# Patient Record
Sex: Male | Born: 1955 | Race: White | Hispanic: No | Marital: Single | Smoking: Current every day smoker
Health system: Southern US, Community
[De-identification: ages and names within clinical notes are randomized; demographics above are authoritative.]

## PROBLEM LIST (undated history)

## (undated) DIAGNOSIS — J449 Chronic obstructive pulmonary disease, unspecified: Secondary | ICD-10-CM

## (undated) DIAGNOSIS — I10 Essential (primary) hypertension: Secondary | ICD-10-CM

## (undated) DIAGNOSIS — I739 Peripheral vascular disease, unspecified: Secondary | ICD-10-CM

## (undated) DIAGNOSIS — E7800 Pure hypercholesterolemia, unspecified: Secondary | ICD-10-CM

## (undated) HISTORY — DX: Chronic obstructive pulmonary disease, unspecified: J44.9

## (undated) HISTORY — PX: ELBOW SURGERY: SHX618

## (undated) HISTORY — PX: APPENDECTOMY: SHX54

---

## 2013-03-30 ENCOUNTER — Inpatient Hospital Stay: Payer: Self-pay | Admitting: Internal Medicine

## 2013-03-30 LAB — CBC WITH DIFFERENTIAL/PLATELET
Basophil #: 0.1 10*3/uL (ref 0.0–0.1)
Basophil %: 0.8 %
Eosinophil #: 0.1 10*3/uL (ref 0.0–0.7)
Eosinophil %: 0.4 %
HGB: 16.7 g/dL (ref 13.0–18.0)
Lymphocyte %: 10.4 %
MCHC: 35 g/dL (ref 32.0–36.0)
Monocyte %: 9.6 %
Neutrophil #: 11.3 10*3/uL — ABNORMAL HIGH (ref 1.4–6.5)
Neutrophil %: 78.8 %
RBC: 4.81 10*6/uL (ref 4.40–5.90)
RDW: 13.1 % (ref 11.5–14.5)

## 2013-03-30 LAB — BASIC METABOLIC PANEL
Anion Gap: 7 (ref 7–16)
BUN: 12 mg/dL (ref 7–18)
Chloride: 100 mmol/L (ref 98–107)
Co2: 27 mmol/L (ref 21–32)
EGFR (African American): >60
Osmolality: 268 (ref 275–301)
Sodium: 134 mmol/L — ABNORMAL LOW (ref 136–145)

## 2013-03-31 LAB — BASIC METABOLIC PANEL
Anion Gap: 9 (ref 7–16)
Calcium, Total: 8.7 mg/dL (ref 8.5–10.1)
Co2: 25 mmol/L (ref 21–32)
EGFR (Non-African Amer.): >60
Glucose: 159 mg/dL — ABNORMAL HIGH (ref 65–99)
Sodium: 134 mmol/L — ABNORMAL LOW (ref 136–145)

## 2013-03-31 LAB — CBC WITH DIFFERENTIAL/PLATELET
Basophil %: 0.2 %
Eosinophil #: 0 10*3/uL (ref 0.0–0.7)
Eosinophil %: 0 %
HGB: 15 g/dL (ref 13.0–18.0)
Lymphocyte %: 5.3 %
MCHC: 35.2 g/dL (ref 32.0–36.0)
Monocyte #: 0.4 x10 3/mm (ref 0.2–1.0)
Neutrophil #: 10.7 10*3/uL — ABNORMAL HIGH (ref 1.4–6.5)
RDW: 13.4 % (ref 11.5–14.5)

## 2013-03-31 LAB — PRO B NATRIURETIC PEPTIDE: B-Type Natriuretic Peptide: 569 pg/mL — ABNORMAL HIGH (ref 0–125)

## 2013-04-01 LAB — MAGNESIUM: Magnesium: 2.2 mg/dL

## 2013-04-01 LAB — BASIC METABOLIC PANEL
BUN: 17 mg/dL (ref 7–18)
Calcium, Total: 9.1 mg/dL (ref 8.5–10.1)
Chloride: 102 mmol/L (ref 98–107)
Co2: 24 mmol/L (ref 21–32)
EGFR (Non-African Amer.): >60
Osmolality: 268 (ref 275–301)

## 2013-04-01 LAB — CBC WITH DIFFERENTIAL/PLATELET
Basophil #: 0.1 10*3/uL (ref 0.0–0.1)
Eosinophil #: 0 10*3/uL (ref 0.0–0.7)
HCT: 45.8 % (ref 40.0–52.0)
Lymphocyte #: 0.9 10*3/uL — ABNORMAL LOW (ref 1.0–3.6)
Lymphocyte %: 4 %
MCH: 34.6 pg — ABNORMAL HIGH (ref 26.0–34.0)
MCV: 100 fL (ref 80–100)
Monocyte #: 1.4 x10 3/mm — ABNORMAL HIGH (ref 0.2–1.0)
Platelet: 243 10*3/uL (ref 150–440)
RBC: 4.56 10*6/uL (ref 4.40–5.90)
WBC: 21.6 10*3/uL — ABNORMAL HIGH (ref 3.8–10.6)

## 2013-04-02 LAB — BASIC METABOLIC PANEL
Anion Gap: 5 — ABNORMAL LOW (ref 7–16)
BUN: 22 mg/dL — ABNORMAL HIGH (ref 7–18)
Calcium, Total: 8.6 mg/dL (ref 8.5–10.1)
Co2: 27 mmol/L (ref 21–32)
Creatinine: 0.83 mg/dL (ref 0.60–1.30)
EGFR (African American): >60
Glucose: 134 mg/dL — ABNORMAL HIGH (ref 65–99)
Osmolality: 277 (ref 275–301)
Sodium: 136 mmol/L (ref 136–145)

## 2013-04-04 LAB — CBC WITH DIFFERENTIAL/PLATELET
Basophil #: 0.1 10*3/uL (ref 0.0–0.1)
Basophil %: 0.4 %
HGB: 15 g/dL (ref 13.0–18.0)
Lymphocyte #: 1.6 10*3/uL (ref 1.0–3.6)
Lymphocyte %: 10.5 %
MCH: 34.3 pg — ABNORMAL HIGH (ref 26.0–34.0)
MCHC: 34.4 g/dL (ref 32.0–36.0)
MCV: 100 fL (ref 80–100)
Monocyte #: 1.4 x10 3/mm — ABNORMAL HIGH (ref 0.2–1.0)
Neutrophil #: 12.1 10*3/uL — ABNORMAL HIGH (ref 1.4–6.5)
Neutrophil %: 79.7 %
WBC: 15.1 10*3/uL — ABNORMAL HIGH (ref 3.8–10.6)

## 2014-09-24 NOTE — H&P (Signed)
PATIENT NAME:  Darryl Gonzales, Darryl Gonzales MR#:  [id number] DATE OF BIRTH:  1955/11/20  DATE OF ADMISSION:  03/30/2013  PRIMARY CARE PROVIDER: None.   EMERGENCY DEPARTMENT REFERRING PHYSICIAN: Dr. Shaune PollackLord.   CHIEF COMPLAINT: Shortness of breath, cough.   HISTORY OF PRESENT ILLNESS: The patient is a 59 year old white male with significant history of smoking, greater than 2 to 3 packs per day for 40 years, who has some mild chronic shortness of breath according to him. He reports that since last Saturday he started having worsening shortness of breath. He was tachypneic when he came in. He also reports that he has been having a productive cough. He states that he has not looked at the sputum to see what color it is. He has had some low-grade fevers and has had some body aches and some chills. He also has had wheezing. He denies any chest pains, palpitations. Denies any nausea or vomiting. Reports that he has a little bit of diarrhea as well. He otherwise denies any sick contacts.   PAST MEDICAL HISTORY: History of pneumonia in the past, history of hypertension which is untreated.   PAST SURGICAL HISTORY: Status post tonsillectomy, appendectomy, left leg fracture which was not repaired.   ALLERGIES: SULFA.   CURRENT MEDICATIONS: He uses Advil p.r.n.   SOCIAL HISTORY: Smokes 2 to 3 packs per day for 40 years. Drinks 3 to 4 beers daily. Denies any drug use.   FAMILY HISTORY: No history of lung disease or hypertension.   REVIEW OF SYSTEMS:    CONSTITUTIONAL: Complains of low-grade fevers, fatigue, weakness. Complains of pain all over due to cough. No weight loss. No weight gains.  EYES: No blurred or double vision. No pain. No redness. No inflammation. No glaucoma. No cataracts.  EARS, NOSE, THROAT: No tinnitus. No ear pain. He has chronic hearing loss. No epistaxis. No difficulty swallowing.  RESPIRATORY: Complains of cough, wheezing. No hemoptysis. No diagnosis of COPD before.  CARDIOVASCULAR: Complains of  chest pain with coughing. No orthopnea. No edema. Complains of dyspnea on exertion. No syncope.  GASTROINTESTINAL: No nausea, vomiting. No abdominal pain. No hematemesis. No melena. No ulcer. No GERD. No IBS.  GENITOURINARY: Denies any dysuria, hematuria, renal calculus or frequency.  ENDOCRINE: Denies any polyuria, nocturia or thyroid problems.  HEMATOLOGIC AND LYMPHATIC: Denies any major bruisability or bleeding.  SKIN: No acne. No rash. No changes in mole, hair or skin.  MUSCULOSKELETAL: Denies any pain in neck, back or shoulder.  NEUROLOGIC: No numbness. No CVA. No TIA.  PSYCHIATRIC: No anxiety. No insomnia. No ADD.   PHYSICAL EXAMINATION: VITAL SIGNS: Temperature 98.1, pulse 97, respirations 28, blood pressure 176/87, O2 sat 89% on room air.  GENERAL: The patient is a thin male in mild respiratory distress.  HEENT: Head atraumatic, normocephalic. Pupils equally round, reactive to light and accommodation. There is no conjunctival pallor. No scleral icterus. Nasal exam shows no drainage or ulcerations. Ear: There is no drainage or external lesions. Mouth is moist without any exudate.  NECK: Supple and symmetric. No masses. Thyroid midline, not enlarged. No JVD.  RESPIRATORY: There is some accessory muscle usage. Decrease in breath sounds throughout both lungs. There is no wheezing or rhonchi currently.  CARDIOVASCULAR: Regular rate and rhythm. No murmurs, gallops, clicks or heaves.  ABDOMEN: Soft, nontender, nondistended. Positive bowel sounds x 4.  GENITOURINARY: Deferred.  MUSCULOSKELETAL: There is no erythema or swelling.  SKIN: No rash.  LYMPHATICS: No lymph nodes palpable.  VASCULAR: Good DP, PT pulses.  PSYCHIATRIC: Not anxious.  NEUROLOGIC: Cranial nerves II through XII grossly intact. No focal deficits.   LABORATORY AND RADIOLOGICAL DATA: Glucose 99, BUN 12, creatinine 0.65, sodium 134, potassium 4.1, chloride 100, CO2 of 27, calcium 8.9. WBC 14.3, hemoglobin 16.7; platelet count  is 239. Chest x-ray per interpretation of the ED physician showed no acute abnormality.   ASSESSMENT AND PLAN: The patient is a 59 year old year-old white male with greater than 100 pack-years of smoking, presents with shortness of breath, cough for the past few days, has some chronic shortness of breath.  1.  Acute respiratory failure: Likely due to acute bronchitis and a chronic obstructive pulmonary disease exacerbation, which would be a new diagnosis for this patient. We will place him on nebulizers, IV Solu-Medrol, IV antibiotics with Levaquin. We will get pulmonary evaluation and he will need eventual PFTs.  2.  Acute bronchitis: We will treat him with IV Levaquin.  3.  Accelerated hypertension: I am going to start him on hydrochlorothiazide, p.r.n. hydralazine. 4.  Nicotine addiction: The patient was counseled regarding smoking cessation, 4 minutes spent. Nicotine patch is offered, which the patient states that he will take.  5.  Miscellaneous: Will place him on Lovenox for deep vein thrombosis prophylaxis.   NOTE: 45 minutes spent on this H and P.     ____________________________ Lacie Scotts. Allena Katz, MD shp:jm D: 03/30/2013 17:20:08 ET T: 03/30/2013 19:00:34 ET JOB#: [id number]  cc: Myan Locatelli H. Allena Katz, MD, <Dictator>  Charise Carwin MD ELECTRONICALLY SIGNED 03/31/2013 12:18

## 2014-09-24 NOTE — Discharge Summary (Signed)
PATIENT NAME:  Darryl BondBARE, Darryl Gonzales MR#:  [id number] DATE OF BIRTH:  [birth year]  DATE OF ADMISSION:  03/30/2013 DATE OF DISCHARGE:  04/05/2013  DISCHARGE DIAGNOSES: 1. Chronic obstructive pulmonary disease exacerbation.  2. Hypertension.  3. Hyponatremia.  4. Generalized anxiety.  5. Alcoholism.   CONDITION ON DISCHARGE: Stable.   CODE STATUS: FULL CODE.   MEDICATIONS ON DISCHARGE: 1. Prednisone 10 mg oral tablet start 60 mg and taper x 5 mg daily until complete.  2. Acetaminophen and oxycodone every six hours as needed for pain.  3. Alprazolam 1 mg oral tablet every eight hours for seven days.  4. Fluticasone and salmeterol inhalation 2 times a day.  5. Spiriva 18 mcg 1 capsule once a day.  6. Amlodipine 5 mg oral tablet once a day.  7. Albuterol inhalation 2 puffs 4 times a day.  8. Pulmicort Flexhaler 1 puff 2 times a day.  9. Nicotine patch transdermal once a day.  10. Lorazepam 0.5 mg oral tablet every six hours as needed for anxiety.  Advised to have low sodium diet. Regular consistency.   ACTIVITY: As tolerated.   TIMEFRAME TO FOLLOW-UP: Within one to two weeks with primary care physician, and  Dr. Ned ClinesHerbon Fleming, pulmonologist.   HISTORY OF PRESENT ILLNESS: A 59 year old male with significant history of smoking for 2 to 3 packs per day for 40 years started having worsening of shortness of breath, was tachypneic when he came, had productive cough and he has some low-grade fever, body aches and chills. Denied any chest pain, palpitation. He also had  with diarrhea, otherwise he denied any sick contacts.   HOSPITAL COURSE AND STAY:  1. For acute respiratory failure due to chronic obstructive pulmonary disease exacerbation, he was started on IV antibiotics, IV steroids. Spiriva and nebulizer treatment and gradual slow improvement in his overall condition over next 4 to 5 days and so we advised him to continue following with pulmonologist as he has never seen one.  2. Acute  bronchitis we gave IV Levaquin and had significant improvement in his symptoms.  3. Accelerated  hypertension. We started on hydrochlorothiazide, hydralazine, Norvasc.  4. Generalized anxiety.  5. Nicotine addiction. The patient was counseled regarding smoking cessation.  6. Hyponatremia. It was minimal and stable.  IMPORTANT LABORATORY RESULTS IN THE HOSPITAL: Creatinine was 0.65. Sodium was 134 on admission. Blood culture was no growth. White cell count was 14.3, and hemoglobin was 16.7. Chest x-ray portable on admission showed no evidence of acute cardiopulmonary disease. Influenza A and B was negative. Creatinine was 0.93, BNP was 569. WBC count went up to 21,000, possibly due to steroids.   TOTAL TIME SPENT ON THIS DISCHARGE: 40 minutes.   ____________________________ Hope PigeonVaibhavkumar G. Elisabeth PigeonVachhani, MD vgv:sg D: 04/09/2013 08:20:09 ET T: 04/09/2013 08:57:37 ET JOB#: [id number]  cc: Hope PigeonVaibhavkumar G. Elisabeth PigeonVachhani, MD, <Dictator> Myrla HalstedMaurice Almondre Herbon E. Meredeth IdeFleming, MD  Heath GoldVAIBHAVKUMAR [facility] Hospital[facility] MD ELECTRONICALLY SIGNED 04/20/2013 8:09

## 2014-11-09 ENCOUNTER — Other Ambulatory Visit: Payer: Self-pay | Admitting: Specialist

## 2014-11-09 DIAGNOSIS — I739 Peripheral vascular disease, unspecified: Secondary | ICD-10-CM

## 2014-11-09 DIAGNOSIS — M79605 Pain in left leg: Secondary | ICD-10-CM

## 2014-11-09 DIAGNOSIS — M79604 Pain in right leg: Secondary | ICD-10-CM

## 2015-01-18 ENCOUNTER — Encounter: Payer: Self-pay | Admitting: *Deleted

## 2015-01-18 ENCOUNTER — Encounter
Admission: RE | Disposition: A | Discharge status: Home / Self Care | Payer: Self-pay | Source: Ambulatory Visit | Attending: Vascular Surgery

## 2015-01-18 ENCOUNTER — Ambulatory Visit
Admission: RE | Admit: 2015-01-18 | Discharge: 2015-01-18 | Disposition: A | Discharge status: Home / Self Care | Payer: BLUE CROSS/BLUE SHIELD | Source: Ambulatory Visit | Attending: Vascular Surgery | Admitting: Vascular Surgery

## 2015-01-18 DIAGNOSIS — F17210 Nicotine dependence, cigarettes, uncomplicated: Secondary | ICD-10-CM | POA: Insufficient documentation

## 2015-01-18 DIAGNOSIS — I70213 Atherosclerosis of native arteries of extremities with intermittent claudication, bilateral legs: Secondary | ICD-10-CM | POA: Insufficient documentation

## 2015-01-18 DIAGNOSIS — I10 Essential (primary) hypertension: Secondary | ICD-10-CM | POA: Insufficient documentation

## 2015-01-18 DIAGNOSIS — Z79899 Other long term (current) drug therapy: Secondary | ICD-10-CM | POA: Diagnosis not present

## 2015-01-18 DIAGNOSIS — E780 Pure hypercholesterolemia: Secondary | ICD-10-CM | POA: Diagnosis not present

## 2015-01-18 HISTORY — DX: Essential (primary) hypertension: I10

## 2015-01-18 HISTORY — PX: PERIPHERAL VASCULAR CATHETERIZATION: SHX172C

## 2015-01-18 HISTORY — DX: Peripheral vascular disease, unspecified: I73.9

## 2015-01-18 HISTORY — DX: Pure hypercholesterolemia, unspecified: E78.00

## 2015-01-18 LAB — CREATININE, SERUM: Creatinine, Ser: 0.95 mg/dL (ref 0.61–1.24)

## 2015-01-18 LAB — BUN: BUN: 21 mg/dL — ABNORMAL HIGH (ref 6–20)

## 2015-01-18 SURGERY — LOWER EXTREMITY ANGIOGRAPHY
Anesthesia: Moderate Sedation | Laterality: Left

## 2015-01-18 MED ORDER — HEPARIN (PORCINE) IN NACL 2-0.9 UNIT/ML-% IJ SOLN
INTRAMUSCULAR | Status: AC
  Filled 2015-01-18: qty 1000

## 2015-01-18 MED ORDER — IOHEXOL 300 MG/ML  SOLN
INTRAMUSCULAR | Status: DC | PRN
  Administered 2015-01-18: 80 mL via INTRA_ARTERIAL

## 2015-01-18 MED ORDER — HYDROMORPHONE HCL 1 MG/ML IJ SOLN: 0.5000 mg | INTRAMUSCULAR | Status: DC | PRN

## 2015-01-18 MED ORDER — HEPARIN SODIUM (PORCINE) 1000 UNIT/ML IJ SOLN
INTRAMUSCULAR | Status: AC
  Filled 2015-01-18: qty 1

## 2015-01-18 MED ORDER — ONDANSETRON HCL 4 MG/2ML IJ SOLN: 4.0000 mg | Freq: Four times a day (QID) | INTRAMUSCULAR | Status: DC | PRN

## 2015-01-18 MED ORDER — CLOPIDOGREL BISULFATE 75 MG PO TABS
300.0000 mg | ORAL_TABLET | ORAL | Status: AC
  Administered 2015-01-18: 300 mg via ORAL

## 2015-01-18 MED ORDER — FENTANYL CITRATE (PF) 100 MCG/2ML IJ SOLN
INTRAMUSCULAR | Status: AC
  Filled 2015-01-18: qty 2

## 2015-01-18 MED ORDER — MIDAZOLAM HCL 2 MG/2ML IJ SOLN
INTRAMUSCULAR | Status: DC | PRN
  Administered 2015-01-18: 2 mg via INTRAVENOUS
  Administered 2015-01-18 (×2): 1 mg via INTRAVENOUS
  Administered 2015-01-18: 2 mg via INTRAVENOUS

## 2015-01-18 MED ORDER — OXYCODONE HCL 5 MG PO TABS: 5.0000 mg | ORAL_TABLET | ORAL | Status: DC | PRN

## 2015-01-18 MED ORDER — MIDAZOLAM HCL 2 MG/2ML IJ SOLN
INTRAMUSCULAR | Status: AC
  Filled 2015-01-18: qty 2

## 2015-01-18 MED ORDER — CEFAZOLIN SODIUM 1-5 GM-% IV SOLN
INTRAVENOUS | Status: AC
  Filled 2015-01-18: qty 50

## 2015-01-18 MED ORDER — LIDOCAINE HCL (PF) 1 % IJ SOLN
INTRAMUSCULAR | Status: DC | PRN
  Administered 2015-01-18: 5 mL via INTRADERMAL

## 2015-01-18 MED ORDER — CLOPIDOGREL BISULFATE 75 MG PO TABS
ORAL_TABLET | ORAL | Status: AC
  Filled 2015-01-18: qty 4

## 2015-01-18 MED ORDER — CEFAZOLIN SODIUM 1-5 GM-% IV SOLN
1.0000 g | Freq: Once | INTRAVENOUS | Status: AC
  Administered 2015-01-18: 1 g via INTRAVENOUS

## 2015-01-18 MED ORDER — CLOPIDOGREL BISULFATE 75 MG PO TABS: 75.0000 mg | ORAL_TABLET | Freq: Every day | ORAL | Status: DC

## 2015-01-18 MED ORDER — ACETAMINOPHEN 325 MG PO TABS: 325.0000 mg | ORAL_TABLET | ORAL | Status: DC | PRN

## 2015-01-18 MED ORDER — SODIUM CHLORIDE 0.9 % IJ SOLN
INTRAMUSCULAR | Status: AC
  Filled 2015-01-18: qty 15

## 2015-01-18 MED ORDER — MIDAZOLAM HCL 5 MG/5ML IJ SOLN
INTRAMUSCULAR | Status: AC
  Filled 2015-01-18: qty 5

## 2015-01-18 MED ORDER — LIDOCAINE HCL (PF) 1 % IJ SOLN
INTRAMUSCULAR | Status: AC
  Filled 2015-01-18: qty 10

## 2015-01-18 MED ORDER — FENTANYL CITRATE (PF) 100 MCG/2ML IJ SOLN
INTRAMUSCULAR | Status: DC | PRN
  Administered 2015-01-18 (×2): 50 ug via INTRAVENOUS
  Administered 2015-01-18: 100 ug via INTRAVENOUS
  Administered 2015-01-18: 50 ug via INTRAVENOUS

## 2015-01-18 MED ORDER — ACETAMINOPHEN 325 MG RE SUPP
325.0000 mg | RECTAL | Status: DC | PRN
Start: 2015-01-18 — End: 2015-01-18

## 2015-01-18 MED ORDER — SODIUM CHLORIDE 0.9 % IV SOLN
INTRAVENOUS | Status: DC
  Administered 2015-01-18 (×2): via INTRAVENOUS

## 2015-01-18 MED ORDER — HEPARIN SODIUM (PORCINE) 1000 UNIT/ML IJ SOLN
INTRAMUSCULAR | Status: DC | PRN
  Administered 2015-01-18: 4000 [IU] via INTRAVENOUS

## 2015-01-18 SURGICAL SUPPLY — 20 items
BALLN LUTONIX 5X150X130 (BALLOONS) ×4
BALLN LUTONIX 6X150X130 (BALLOONS) ×4
BALLN LUTONIX DCB 5X100X130 (BALLOONS) ×4
BALLOON LUTONIX 5X150X130 (BALLOONS) ×2 IMPLANT
BALLOON LUTONIX 6X150X130 (BALLOONS) ×2 IMPLANT
BALLOON LUTONIX DCB 5X100X130 (BALLOONS) ×2 IMPLANT
CATH 5FX125 BOWTIP (CATHETERS) ×4 IMPLANT
CATH PIG 70CM (CATHETERS) ×4 IMPLANT
DEVICE PRESTO INFLATION (MISCELLANEOUS) ×4 IMPLANT
DEVICE SAFEGUARD 24CM (GAUZE/BANDAGES/DRESSINGS) ×4 IMPLANT
DEVICE STARCLOSE SE CLOSURE (Vascular Products) ×4 IMPLANT
GLIDEWIRE ANGLED SS 035X260CM (WIRE) ×4 IMPLANT
PACK ANGIOGRAPHY (CUSTOM PROCEDURE TRAY) ×4 IMPLANT
SET INTRO CAPELLA COAXIAL (SET/KITS/TRAYS/PACK) ×4 IMPLANT
SHEATH ANL2 6FRX45 HC (SHEATH) ×4 IMPLANT
SHEATH BRITE TIP 5FRX11 (SHEATH) ×4 IMPLANT
SYR MEDRAD MARK V 150ML (SYRINGE) ×4 IMPLANT
TUBING CONTRAST HIGH PRESS 72 (TUBING) ×4 IMPLANT
WIRE HI TORQ VERSACORE 300 (WIRE) ×4 IMPLANT
WIRE J 3MM .035X145CM (WIRE) ×4 IMPLANT

## 2015-01-18 NOTE — Progress Notes (Signed)
Pt doing well post procedure, no bleeding nor hematoma visible at right groin site, pt has nagging nonproductive cough intermittently.(history of long term tobacco use), encouraged pt to hold right groin when coughing to discourage bleeding  At procedure site, tends not to do so when encouraged . Taking po;'s without difficulty, script given for plavix with loading dose given earlier per orders.

## 2015-01-18 NOTE — H&P (Signed)
[facility] History & Physical Update  The patient was interviewed and re-examined.  The patient's previous History and Physical has been reviewed and is unchanged.  There is no change in the plan of care. We plan to proceed with the scheduled procedure.  Randol Zumstein G, MD  01/18/2015, 11:43 AM   

## 2015-01-18 NOTE — Discharge Instructions (Signed)

## 2015-01-18 NOTE — Op Note (Signed)
[facility]  Percutaneous Study/Intervention Procedural Note   Date of Surgery: 01/18/2015  Surgeon:Schnier, Dolores Lory   Pre-operative Diagnosis: Atherosclerotic occlusive disease bilateral lower extremities with lifestyle limiting claudication; painful lower extremity; hypertension  Post-operative diagnosis:  Same  Procedure(s) Performed:  1.  Abdominal aortogram  2.  Left lower extremity distal runoff third order catheter placement  3.  Percutaneous transluminal and plasty of the left SFA to 6 mm using the Lutonix balloon  4.  Closure right femoral puncture site with a Star close   Anesthesia: Conscious sedation  Sheath: 6 French right common femoral artery retrograde  Contrast: 80 mls  Fluoroscopy Time: 7.7 minutes  Indications:  Patient presented to the office with the complaint of lower sternum any pain he is still working and is finding it increasingly hard to do his job and describes lifestyle limitations. Noninvasive studies as well as physical examination suggested significant atherosclerotic occlusive disease. He is therefore undergoing angiography with the hope for intervention. Risks and benefits of been reviewed all questions of been answered patient agrees to proceed  Procedure:  HARDIE VELTRE a 59 y.o. male who was identified and appropriate procedural time out was performed.  The patient was then placed supine on the table and prepped and draped in the usual sterile fashion.  Ultrasound was used to evaluate the right common femoral artery.  It was patent .  A digital ultrasound image was acquired.  A micropuncture needle was used to access the right common femoral artery under direct ultrasound guidance and a permanent image was performed.  Microwire followed by a micro-sheath was then inserted without difficulty under fluoroscopic guidance. A 0.035 J wire was advanced without resistance and a 5Fr sheath was placed.    Pigtail catheter is  positioned to the level of T12 and AP projection of the aorta was obtained. Pigtail catheter was repositioned to above the bifurcation and an RAO view of the pelvis was obtained. Stiff angled Glidewire pigtail catheter were then used to cross the aortic bifurcation and LAO projection of the left femoral bifurcation was obtained. Wire was then reintroduced and negotiated into the SFA. Distal runoff was obtained via the pigtail catheter located in the SFA. Distal imaging were still inadequate and a later point in the case straight catheter positioned in the distal popliteal was used to complete runoff. After reviewing these images 4000 units of heparin was given and allowed circulate for several minutes.  Stiff angle glide wires reintroduced pigtail catheter was removed and a 6 [place] Ansell sheath is advanced up and over the bifurcation and positioned with its tip in the common femoral artery on the left. Using a straight catheter and an angled Glidewire the lesions within the SFA were negotiated intraluminal positioning was verified with hand injection contrast in the mid popliteal via straight catheter.  Versa core Wire was then reintroduced.    Initially a 5 x 15 and then a 5 x 10 balloon was used and plasty the SFA inflations were to 10 atm for 3 full minutes. Subsequent angiography demonstrated the midportion was still undersized and therefore a 6 x 15 balloon was advanced across this area and inflation to 810 atm for 3 minutes was performed. Follow-up imaging demonstrates an excellent result with less than 10% residual stenosis and no evidence of flow limitation or dissection. Hand injection contrast was then used to perform distal runoff which demonstrated preservation of the outflow.  Sheath was pulled into the right external iliac  oblique views obtained and a Star close device deployed without difficulty or no immediate complications    Findings:   Aortogram:  The abdominal aorta is opacified the  bolus injection contrast. There are mild atherosclerotic changes but no evidence of hemodynamically significant lesions.  Right Lower Extremity:  The right common femoral origin of the profunda femoris and sent superficial femoral arteries are patent. Common and external iliac arteries on the right are patent.  Left Lower Extremity:  The left common and external iliac arteries are widely patent no evidence of hematoma and anatomically significant lesions. 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 The origin of the SFA and the profunda are both patent. Within the SFA in its middle two thirds there is diffuse disease with several areas of string sign in tandem. At [facility] canal the SFA is more normalized in caliber and the above-the-knee popliteal is patent. Trifurcation is patent and there is two-vessel runoff to the foot via the anterior tibial and the posterior tibial area posterior tibial and and the peroneal are rather small and demonstrate diffuse disease.  Following angioplasty first to 5 and the 6 mm there is excellent result across the SFA no stent is indicated at this time  Summary successful recanalization of the left SFA using Lutonix balloon antroplasty to a maximal dimension of 6 mm  Disposition: Patient was taken to the recovery room in stable condition having tolerated the procedure well.  Schnier, Dolores Lory 01/18/2015,11:44 AM

## 2015-07-16 IMAGING — CR DG CHEST 1V PORT
1 series · 1 of 1 positions shown · non-contrast
Comparison: none

REASON FOR EXAM: sob
COMMENTS:

PROCEDURE:     DXR - DXR PORTABLE CHEST SINGLE VIEW  - March 30, 2013  [DATE]
RESULT:     The lungs are clear. The cardiac silhouette and visualized bony
skeleton are unremarkable.

[ap]
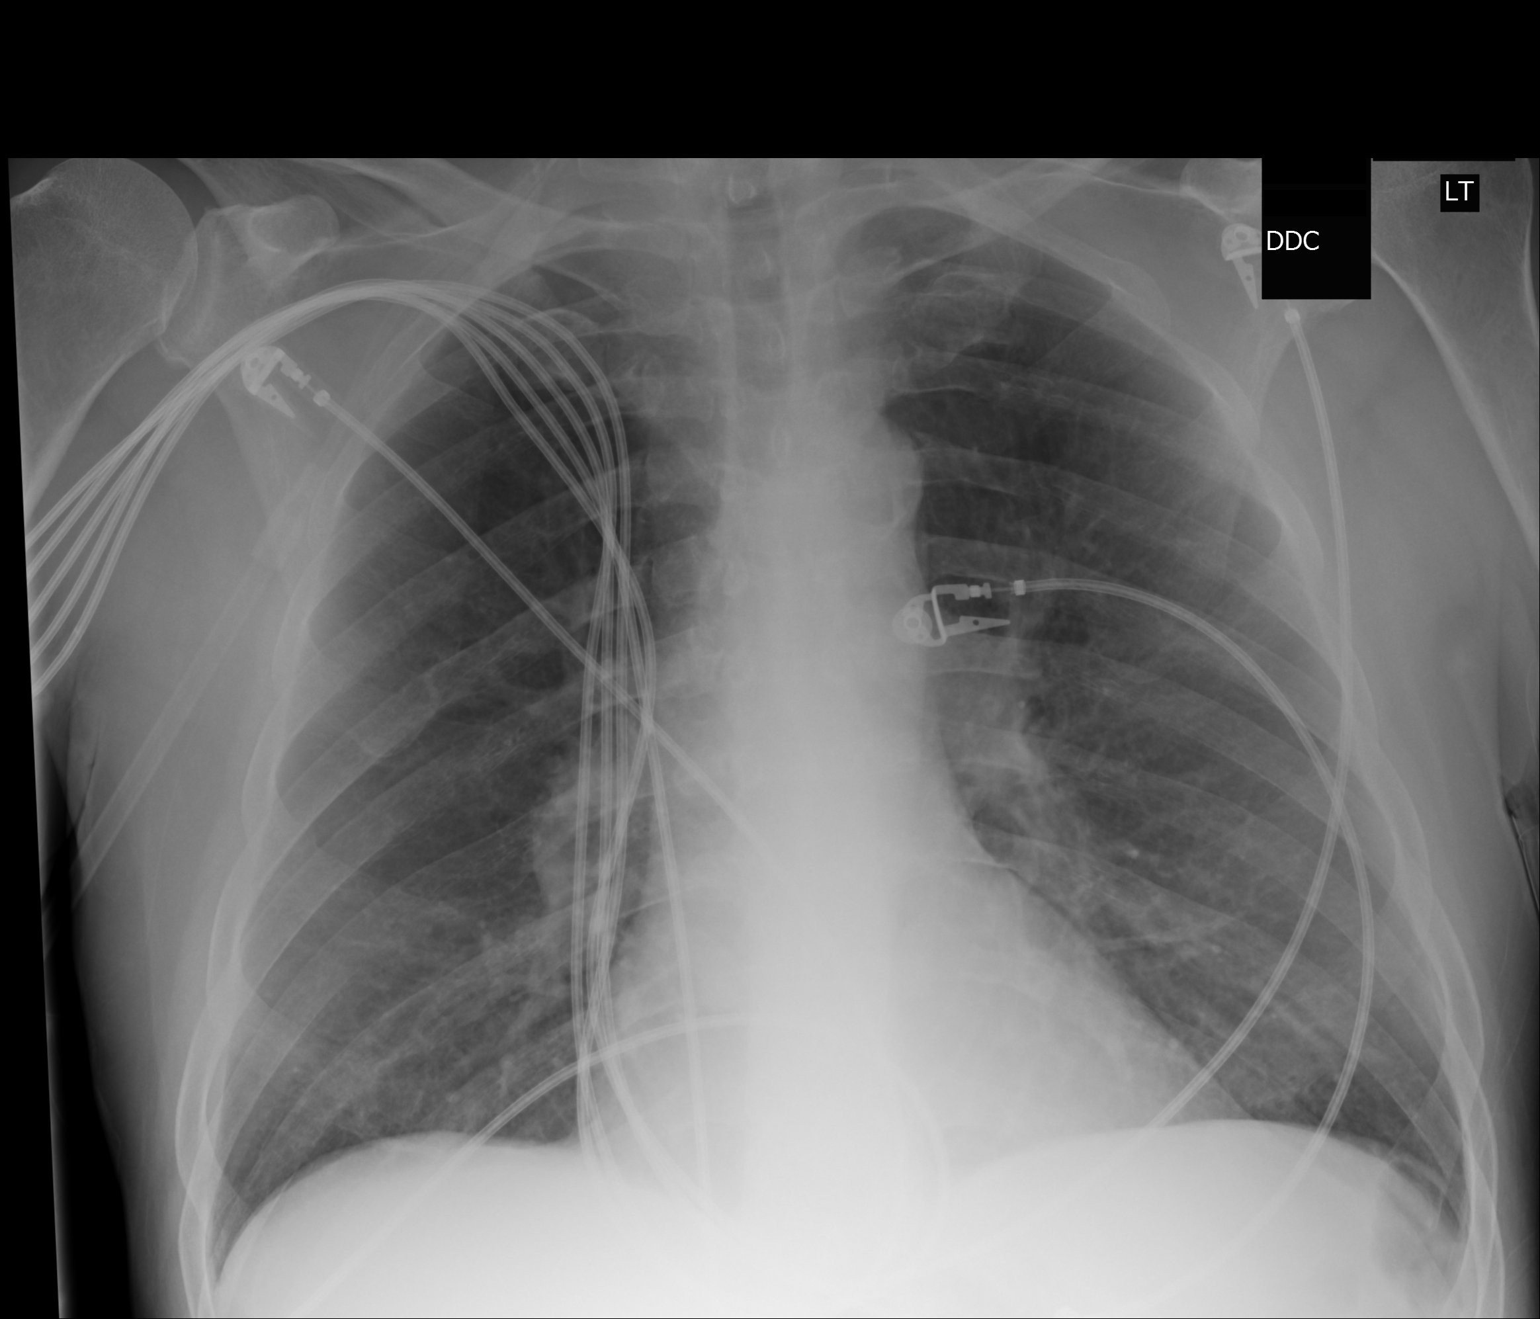

[1 of 1 positions shown; findings below may reference images not displayed]

IMPRESSION: 1. Chest radiograph without evidence of acute cardiopulmonary disease.

## 2016-11-13 ENCOUNTER — Inpatient Hospital Stay
Admission: EM | Admit: 2016-11-13 | Discharge: 2016-11-18 | DRG: 190 | Disposition: A | Discharge status: Home / Self Care | Payer: BLUE CROSS/BLUE SHIELD | Attending: Internal Medicine | Admitting: Internal Medicine

## 2016-11-13 ENCOUNTER — Emergency Department: Payer: BLUE CROSS/BLUE SHIELD

## 2016-11-13 ENCOUNTER — Inpatient Hospital Stay
Admit: 2016-11-13 | Discharge: 2016-11-13 | Disposition: A | Discharge status: Home / Self Care | Payer: BLUE CROSS/BLUE SHIELD | Attending: Internal Medicine | Admitting: Internal Medicine

## 2016-11-13 DIAGNOSIS — F17210 Nicotine dependence, cigarettes, uncomplicated: Secondary | ICD-10-CM | POA: Diagnosis present

## 2016-11-13 DIAGNOSIS — R739 Hyperglycemia, unspecified: Secondary | ICD-10-CM | POA: Diagnosis not present

## 2016-11-13 DIAGNOSIS — J810 Acute pulmonary edema: Secondary | ICD-10-CM | POA: Diagnosis not present

## 2016-11-13 DIAGNOSIS — T380X5A Adverse effect of glucocorticoids and synthetic analogues, initial encounter: Secondary | ICD-10-CM | POA: Diagnosis not present

## 2016-11-13 DIAGNOSIS — Z515 Encounter for palliative care: Secondary | ICD-10-CM

## 2016-11-13 DIAGNOSIS — J441 Chronic obstructive pulmonary disease with (acute) exacerbation: Secondary | ICD-10-CM

## 2016-11-13 DIAGNOSIS — Z7189 Other specified counseling: Secondary | ICD-10-CM

## 2016-11-13 DIAGNOSIS — Z7902 Long term (current) use of antithrombotics/antiplatelets: Secondary | ICD-10-CM | POA: Diagnosis not present

## 2016-11-13 DIAGNOSIS — R609 Edema, unspecified: Secondary | ICD-10-CM

## 2016-11-13 DIAGNOSIS — I739 Peripheral vascular disease, unspecified: Secondary | ICD-10-CM | POA: Diagnosis present

## 2016-11-13 DIAGNOSIS — I5031 Acute diastolic (congestive) heart failure: Secondary | ICD-10-CM | POA: Diagnosis present

## 2016-11-13 DIAGNOSIS — Z79899 Other long term (current) drug therapy: Secondary | ICD-10-CM | POA: Diagnosis not present

## 2016-11-13 DIAGNOSIS — J209 Acute bronchitis, unspecified: Secondary | ICD-10-CM | POA: Diagnosis present

## 2016-11-13 DIAGNOSIS — J9601 Acute respiratory failure with hypoxia: Secondary | ICD-10-CM

## 2016-11-13 DIAGNOSIS — R443 Hallucinations, unspecified: Secondary | ICD-10-CM | POA: Diagnosis not present

## 2016-11-13 DIAGNOSIS — I110 Hypertensive heart disease with heart failure: Secondary | ICD-10-CM | POA: Diagnosis present

## 2016-11-13 DIAGNOSIS — I248 Other forms of acute ischemic heart disease: Secondary | ICD-10-CM | POA: Diagnosis present

## 2016-11-13 DIAGNOSIS — F10239 Alcohol dependence with withdrawal, unspecified: Secondary | ICD-10-CM | POA: Diagnosis not present

## 2016-11-13 DIAGNOSIS — E7800 Pure hypercholesterolemia, unspecified: Secondary | ICD-10-CM | POA: Diagnosis present

## 2016-11-13 DIAGNOSIS — G629 Polyneuropathy, unspecified: Secondary | ICD-10-CM | POA: Diagnosis present

## 2016-11-13 DIAGNOSIS — J449 Chronic obstructive pulmonary disease, unspecified: Secondary | ICD-10-CM

## 2016-11-13 DIAGNOSIS — Z66 Do not resuscitate: Secondary | ICD-10-CM | POA: Diagnosis present

## 2016-11-13 DIAGNOSIS — E871 Hypo-osmolality and hyponatremia: Secondary | ICD-10-CM | POA: Diagnosis present

## 2016-11-13 DIAGNOSIS — J440 Chronic obstructive pulmonary disease with acute lower respiratory infection: Secondary | ICD-10-CM | POA: Diagnosis present

## 2016-11-13 DIAGNOSIS — J9600 Acute respiratory failure, unspecified whether with hypoxia or hypercapnia: Secondary | ICD-10-CM | POA: Diagnosis present

## 2016-11-13 DIAGNOSIS — R0602 Shortness of breath: Secondary | ICD-10-CM | POA: Diagnosis present

## 2016-11-13 LAB — CBC
HEMATOCRIT: 49.5 % (ref 40.0–52.0)
Hemoglobin: 16.2 g/dL (ref 13.0–18.0)
MCH: 33.9 pg (ref 26.0–34.0)
MCHC: 32.8 g/dL (ref 32.0–36.0)
MCV: 103.4 fL — AB (ref 80.0–100.0)
Platelets: 208 10*3/uL (ref 150–440)
RBC: 4.79 MIL/uL (ref 4.40–5.90)
RDW: 15.1 % — AB (ref 11.5–14.5)
WBC: 11.5 10*3/uL — ABNORMAL HIGH (ref 3.8–10.6)

## 2016-11-13 LAB — BASIC METABOLIC PANEL
Anion gap: 11 (ref 5–15)
BUN: 16 mg/dL (ref 6–20)
CHLORIDE: 101 mmol/L (ref 101–111)
CO2: 22 mmol/L (ref 22–32)
Calcium: 8.3 mg/dL — ABNORMAL LOW (ref 8.9–10.3)
Creatinine, Ser: 1.02 mg/dL (ref 0.61–1.24)
GFR calc Af Amer: >60 mL/min (ref >60)
GFR calc non Af Amer: >60 mL/min (ref >60)
GLUCOSE: 93 mg/dL (ref 65–99)
POTASSIUM: 4.2 mmol/L (ref 3.5–5.1)
Sodium: 134 mmol/L — ABNORMAL LOW (ref 135–145)

## 2016-11-13 LAB — BRAIN NATRIURETIC PEPTIDE: B Natriuretic Peptide: 377 pg/mL — ABNORMAL HIGH (ref 0.0–100.0)

## 2016-11-13 LAB — TROPONIN I: Troponin I: 0.04 ng/mL (ref <0.03)

## 2016-11-13 LAB — MAGNESIUM: Magnesium: 1.8 mg/dL (ref 1.7–2.4)

## 2016-11-13 MED ORDER — LORAZEPAM 2 MG/ML IJ SOLN
2.0000 mg | INTRAMUSCULAR | Status: DC | PRN
  Administered 2016-11-15 (×2): 2 mg via INTRAVENOUS
  Filled 2016-11-13 (×2): qty 1

## 2016-11-13 MED ORDER — MOMETASONE FURO-FORMOTEROL FUM 200-5 MCG/ACT IN AERO
2.0000 | INHALATION_SPRAY | Freq: Two times a day (BID) | RESPIRATORY_TRACT | Status: DC
  Administered 2016-11-13 – 2016-11-14 (×2): 2 via RESPIRATORY_TRACT
  Filled 2016-11-13: qty 8.8

## 2016-11-13 MED ORDER — METHYLPREDNISOLONE SODIUM SUCC 125 MG IJ SOLR
60.0000 mg | Freq: Four times a day (QID) | INTRAMUSCULAR | Status: DC
  Administered 2016-11-13 – 2016-11-14 (×3): 60 mg via INTRAVENOUS
  Filled 2016-11-13 (×3): qty 2

## 2016-11-13 MED ORDER — DEXTROSE 5 % IV SOLN
500.0000 mg | Freq: Once | INTRAVENOUS | Status: AC
  Administered 2016-11-13: 500 mg via INTRAVENOUS
  Filled 2016-11-13: qty 500

## 2016-11-13 MED ORDER — AZITHROMYCIN 500 MG IV SOLR
250.0000 mg | INTRAVENOUS | Status: DC
  Administered 2016-11-14 – 2016-11-15 (×2): 250 mg via INTRAVENOUS
  Filled 2016-11-13 (×3): qty 250

## 2016-11-13 MED ORDER — VITAMIN B-1 100 MG PO TABS
100.0000 mg | ORAL_TABLET | Freq: Every day | ORAL | Status: DC
  Administered 2016-11-13 – 2016-11-18 (×6): 100 mg via ORAL
  Filled 2016-11-13 (×6): qty 1

## 2016-11-13 MED ORDER — DEXTROSE 5 % IV SOLN
1.0000 g | INTRAVENOUS | Status: DC
  Filled 2016-11-13: qty 10

## 2016-11-13 MED ORDER — IPRATROPIUM-ALBUTEROL 0.5-2.5 (3) MG/3ML IN SOLN
3.0000 mL | Freq: Once | RESPIRATORY_TRACT | Status: AC
  Administered 2016-11-13: 3 mL via RESPIRATORY_TRACT
  Filled 2016-11-13: qty 3

## 2016-11-13 MED ORDER — FOLIC ACID 1 MG PO TABS
1.0000 mg | ORAL_TABLET | Freq: Every day | ORAL | Status: DC
  Administered 2016-11-13 – 2016-11-18 (×6): 1 mg via ORAL
  Filled 2016-11-13 (×6): qty 1

## 2016-11-13 MED ORDER — LORAZEPAM 2 MG PO TABS
2.0000 mg | ORAL_TABLET | ORAL | Status: DC | PRN
  Administered 2016-11-13 – 2016-11-18 (×25): 2 mg via ORAL
  Filled 2016-11-13 (×25): qty 1

## 2016-11-13 MED ORDER — NICOTINE 21 MG/24HR TD PT24
21.0000 mg | MEDICATED_PATCH | Freq: Every day | TRANSDERMAL | Status: DC
  Administered 2016-11-13 – 2016-11-18 (×6): 21 mg via TRANSDERMAL
  Filled 2016-11-13 (×6): qty 1

## 2016-11-13 MED ORDER — THIAMINE HCL 100 MG/ML IJ SOLN
100.0000 mg | Freq: Every day | INTRAMUSCULAR | Status: DC
  Filled 2016-11-13: qty 2

## 2016-11-13 MED ORDER — PNEUMOCOCCAL VAC POLYVALENT 25 MCG/0.5ML IJ INJ
0.5000 mL | INJECTION | INTRAMUSCULAR | Status: AC
  Administered 2016-11-14: 11:00:00 0.5 mL via INTRAMUSCULAR
  Filled 2016-11-13: qty 0.5

## 2016-11-13 MED ORDER — DOCUSATE SODIUM 100 MG PO CAPS
100.0000 mg | ORAL_CAPSULE | Freq: Two times a day (BID) | ORAL | Status: DC | PRN
  Administered 2016-11-16: 100 mg via ORAL
  Filled 2016-11-13: qty 1

## 2016-11-13 MED ORDER — FUROSEMIDE 10 MG/ML IJ SOLN
20.0000 mg | Freq: Two times a day (BID) | INTRAMUSCULAR | Status: DC
  Administered 2016-11-13 – 2016-11-17 (×8): 20 mg via INTRAVENOUS
  Filled 2016-11-13 (×8): qty 2

## 2016-11-13 MED ORDER — ADULT MULTIVITAMIN W/MINERALS CH
1.0000 | ORAL_TABLET | Freq: Every day | ORAL | Status: DC
  Administered 2016-11-14 – 2016-11-18 (×5): 1 via ORAL
  Filled 2016-11-13 (×5): qty 1

## 2016-11-13 MED ORDER — HEPARIN SODIUM (PORCINE) 5000 UNIT/ML IJ SOLN
5000.0000 [IU] | Freq: Three times a day (TID) | INTRAMUSCULAR | Status: DC
  Administered 2016-11-13 – 2016-11-14 (×2): 5000 [IU] via SUBCUTANEOUS
  Filled 2016-11-13 (×2): qty 1

## 2016-11-13 MED ORDER — METHYLPREDNISOLONE SODIUM SUCC 125 MG IJ SOLR
125.0000 mg | Freq: Once | INTRAMUSCULAR | Status: AC
  Administered 2016-11-13: 125 mg via INTRAVENOUS
  Filled 2016-11-13: qty 2

## 2016-11-13 MED ORDER — MAGNESIUM SULFATE 2 GM/50ML IV SOLN
2.0000 g | Freq: Once | INTRAVENOUS | Status: AC
  Administered 2016-11-13: 2 g via INTRAVENOUS
  Filled 2016-11-13: qty 50

## 2016-11-13 MED ORDER — GABAPENTIN 300 MG PO CAPS
300.0000 mg | ORAL_CAPSULE | Freq: Three times a day (TID) | ORAL | Status: DC
Start: 2016-11-13 — End: 2016-11-18
  Administered 2016-11-13 – 2016-11-18 (×14): 300 mg via ORAL
  Filled 2016-11-13 (×14): qty 1

## 2016-11-13 MED ORDER — DEXTROSE 5 % IV SOLN
1.0000 g | Freq: Once | INTRAVENOUS | Status: AC
  Administered 2016-11-13: 1 g via INTRAVENOUS
  Filled 2016-11-13: qty 10

## 2016-11-13 MED ORDER — TIOTROPIUM BROMIDE MONOHYDRATE 18 MCG IN CAPS
18.0000 ug | ORAL_CAPSULE | Freq: Every day | RESPIRATORY_TRACT | Status: DC
  Administered 2016-11-14 – 2016-11-18 (×5): 18 ug via RESPIRATORY_TRACT
  Filled 2016-11-13 (×2): qty 5

## 2016-11-13 MED ORDER — BUDESONIDE 0.25 MG/2ML IN SUSP
0.2500 mg | Freq: Two times a day (BID) | RESPIRATORY_TRACT | Status: DC
  Administered 2016-11-14 (×3): 0.25 mg via RESPIRATORY_TRACT
  Filled 2016-11-13 (×4): qty 2

## 2016-11-13 MED ORDER — IPRATROPIUM-ALBUTEROL 0.5-2.5 (3) MG/3ML IN SOLN
3.0000 mL | RESPIRATORY_TRACT | Status: DC
  Administered 2016-11-13 – 2016-11-14 (×5): 3 mL via RESPIRATORY_TRACT
  Filled 2016-11-13 (×5): qty 3

## 2016-11-13 MED ORDER — IBUPROFEN 400 MG PO TABS
200.0000 mg | ORAL_TABLET | Freq: Four times a day (QID) | ORAL | Status: DC | PRN
  Administered 2016-11-15: 200 mg via ORAL
  Filled 2016-11-13: qty 1

## 2016-11-13 NOTE — ED Triage Notes (Signed)
Pt reports that 2 days ago he started having shortness of breath and that his cough that is normally productive has become dry and non-productive - pt states that his medication are not relieving the shortness of breath - he is c/o chest pain that is a pressure feeling at times r/t shortness of breath - reports dizziness and fatigue - wheezing noted in bilat lower lobes with diminished breath sounds

## 2016-11-13 NOTE — ED Notes (Signed)
2L o2 via n/c showed no improvement - increased to 3L o2 sat 88% - increased to 4L increased to 92% - transferred to rm 17

## 2016-11-13 NOTE — Progress Notes (Signed)
Family Meeting Note  Advance Directive:yes  Today a meeting took place with the Patient.  The following clinical team members were present during this meeting:MD  The following were discussed:Patient's diagnosis: respi failure, COPD exacerbation, Chronic smoking and alcoholism, Patient's progosis: Unable to determine and Goals for treatment: DNR  Additional follow-up to be provided: PMD  Time spent during discussion:20 minutes  Darryl Gonzales, Heath GoldVAIBHAVKUMAR, MD

## 2016-11-13 NOTE — H&P (Signed)
[facility] [place] NAME: Darryl Gonzales    MR#:  [id number]  DATE OF BIRTH:  10/19/1955  DATE OF ADMISSION:  11/13/2016  PRIMARY CARE PHYSICIAN: Patient, No Pcp Per   REQUESTING/REFERRING PHYSICIAN: Elige Ko  CHIEF COMPLAINT:   Chief Complaint  Patient presents with  . Shortness of Breath  . Chest Pain    HISTORY OF PRESENT ILLNESS: Darryl Gonzales  is a 61 y.o. male with a known history of Hypercholesterolemia, hypertension, peripheral vascular disease- for last 2-3 days feeling short of breath and chest tightness. Also have associated cough and some yellowish sputum production and chills at home. Concerned with this he came to emergency room and noted to have some pulmonary edema and active wheezing with COPD exacerbation.  PAST MEDICAL HISTORY:   Past Medical History:  Diagnosis Date  . Hypercholesteremia   . Hypertension   . PVD (peripheral vascular disease) ([place])     PAST SURGICAL HISTORY: Past Surgical History:  Procedure Laterality Date  . APPENDECTOMY    . ELBOW SURGERY    . PERIPHERAL VASCULAR CATHETERIZATION Left 01/18/2015   Procedure: Lower Extremity Angiography;  Surgeon: Katha Cabal, MD;  Location: [facility];  Service: Cardiovascular;  Laterality: Left;  . PERIPHERAL VASCULAR CATHETERIZATION  01/18/2015   Procedure: Lower Extremity Intervention;  Surgeon: Katha Cabal, MD;  Location: [facility];  Service: Cardiovascular;;    SOCIAL HISTORY:  Social History  Substance Use Topics  . Smoking status: Current Every Day Smoker    Packs/day: 1.50    Types: Cigarettes  . Smokeless tobacco: Never Used  . Alcohol use No     Comment: occassional drink    FAMILY HISTORY:  Family History  Problem Relation Age of Onset  . Multiple myeloma Sister     DRUG ALLERGIES:  Allergies  Allergen Reactions  . Sulfa Antibiotics Rash    REVIEW OF SYSTEMS:   CONSTITUTIONAL: No fever, fatigue  or weakness.  EYES: No blurred or double vision.  EARS, NOSE, AND THROAT: No tinnitus or ear pain.  RESPIRATORY: Positive for cough, shortness of breath, wheezing or hemoptysis.  CARDIOVASCULAR: No chest pain, orthopnea, edema.  GASTROINTESTINAL: No nausea, vomiting, diarrhea or abdominal pain.  GENITOURINARY: No dysuria, hematuria.  ENDOCRINE: No polyuria, nocturia,  HEMATOLOGY: No anemia, easy bruising or bleeding SKIN: No rash or lesion. MUSCULOSKELETAL: No joint pain or arthritis.   NEUROLOGIC: No tingling, numbness, weakness.  PSYCHIATRY: No anxiety or depression.   MEDICATIONS AT HOME:  Prior to Admission medications   Medication Sig Start Date End Date Taking? Authorizing Provider  albuterol (PROVENTIL HFA;VENTOLIN HFA) 108 (90 BASE) MCG/ACT inhaler Inhale 1 puff into the lungs every 4 (four) hours as needed for wheezing or shortness of breath (every 4-6 hours).   Yes [provider]  Fluticasone-Salmeterol (ADVAIR) 250-50 MCG/DOSE AEPB Inhale 1 puff into the lungs daily.    Yes [provider]  gabapentin (NEURONTIN) 300 MG capsule Take 300 mg by mouth 3 (three) times daily.   Yes [provider]  ibuprofen (ADVIL,MOTRIN) 200 MG tablet Take 200 mg by mouth every 6 (six) hours as needed.   Yes [provider]  tiotropium (SPIRIVA) 18 MCG inhalation capsule Place 18 mcg into inhaler and inhale daily.    Yes [provider]  clopidogrel (PLAVIX) 75 MG tablet Take 1 tablet (75 mg total) by mouth daily. Patient not taking: Reported on 11/13/2016 01/19/15   Schnier,  Dolores Lory, MD      PHYSICAL EXAMINATION:   VITAL SIGNS: Blood pressure 126/71, pulse 87, temperature 97.6 F (36.4 C), temperature source Oral, resp. rate (!) 30, height 5' 7"  (1.702 m), weight 79.8 kg (176 lb), SpO2 (!) 85 %.  GENERAL:  61 y.o.-year-old patient lying in the bed with no acute distress.  EYES: Pupils equal, round, reactive to light and accommodation. No scleral  icterus. Extraocular muscles intact.  HEENT: Head atraumatic, normocephalic. Oropharynx and nasopharynx clear.  NECK:  Supple, no jugular venous distention. No thyroid enlargement, no tenderness.  LUNGS: Normal breath sounds bilaterally, Bilateral wheezing, no crepitation. No use of accessory muscles of respiration.  CARDIOVASCULAR: S1, S2 normal. No murmurs, rubs, or gallops.  ABDOMEN: Soft, nontender, nondistended. Bowel sounds present. No organomegaly or mass.  EXTREMITIES: No pedal edema, cyanosis, or clubbing.  NEUROLOGIC: Cranial nerves II through XII are intact. Muscle strength 5/5 in all extremities. Sensation intact. Gait not checked.  PSYCHIATRIC: The patient is alert and oriented x 3.  SKIN: No obvious rash, lesion, or ulcer.   LABORATORY PANEL:   CBC  Recent Labs Lab 11/13/16 1640  WBC 11.5*  HGB 16.2  HCT 49.5  PLT 208  MCV 103.4*  MCH 33.9  MCHC 32.8  RDW 15.1*   ------------------------------------------------------------------------------------------------------------------  Chemistries   Recent Labs Lab 11/13/16 1640  NA 134*  K 4.2  CL 101  CO2 22  GLUCOSE 93  BUN 16  CREATININE 1.02  CALCIUM 8.3*  MG 1.8   ------------------------------------------------------------------------------------------------------------------ estimated creatinine clearance is 75.1 mL/min (by C-G formula based on SCr of 1.02 mg/dL). ------------------------------------------------------------------------------------------------------------------ No results for input(s): TSH, T4TOTAL, T3FREE, THYROIDAB in the last 72 hours.  Invalid input(s): FREET3   Coagulation profile No results for input(s): INR, PROTIME in the last 168 hours. ------------------------------------------------------------------------------------------------------------------- No results for input(s): DDIMER in the last 72  hours. -------------------------------------------------------------------------------------------------------------------  Cardiac Enzymes  Recent Labs Lab 11/13/16 1640  TROPONINI 0.04*   ------------------------------------------------------------------------------------------------------------------ Invalid input(s): POCBNP  ---------------------------------------------------------------------------------------------------------------  Urinalysis No results found for: COLORURINE, APPEARANCEUR, LABSPEC, PHURINE, GLUCOSEU, HGBUR, BILIRUBINUR, KETONESUR, PROTEINUR, UROBILINOGEN, NITRITE, LEUKOCYTESUR   RADIOLOGY: Dg Chest 2 View  Result Date: 11/13/2016 CLINICAL DATA:  Shortness of breath and cough EXAM: CHEST  2 VIEW COMPARISON:  March 30, 2013 FINDINGS: There is mild interstitial edema. There is no airspace consolidation or volume loss. Heart is slightly enlarged with pulmonary venous hypertension. There is no evident adenopathy. There is aortic atherosclerosis. There is degenerative change in the left and right acromioclavicular joints. IMPRESSION: Findings indicative of a degree of congestive heart failure. No airspace consolidation. There is aortic atherosclerosis. Electronically Signed   By: Lowella Grip III M.D.   On: 11/13/2016 17:07    EKG: Orders placed or performed during the hospital encounter of 11/13/16  . ED EKG within 10 minutes  . ED EKG within 10 minutes  . EKG 12-Lead  . EKG 12-Lead    IMPRESSION AND PLAN:  * Acute respiratory failure with hypoxia   Acute COPD exacerbation   Acute congestive heart failure   Acute bronchitis    Continue supplemental oxygen.   For COPD treat with IV and inhaled steroid, give nebulized bronchodilators and antibiotics.   For suspected CHF I will give IV Lasix and keep on fluid restriction and do echocardiogram to check about ejection fraction.  * Active smoking   Counseled to quit smoking for 4 minutes and offered  nicotine patch.  * Active alcohol drinking daily.   Patient is  a heavy drinker, high risk of alcohol withdrawal so we'll keep on CIWA protocol.   All the records are reviewed and case discussed with ED provider. Management plans discussed with the patient, family and they are in agreement.  CODE STATUS: DO NOT RESUSCITATE Code Status History    Date Active Date Inactive Code Status Order ID Comments User Context   01/18/2015 12:12 PM 01/18/2015  4:48 PM Full Code [id number]  Schnier, Dolores Lory, MD Inpatient     Patient's good friend and neighbor who is present in the 20 minute visit.  TOTAL TIME TAKING CARE OF THIS PATIENT: 50 minutes.    Vaughan Basta M.D on 11/13/2016   Between 7am to 6pm - Pager - [phone]  After 6pm go to [email or web address] - password [email or web address] [facility]  Office  [phone]  CC: Primary care physician; Patient, No Pcp Per   Note: This dictation was prepared with Dragon dictation along with smaller phrase technology. Any transcriptional errors that result from this process are unintentional.

## 2016-11-13 NOTE — ED Notes (Signed)
Pt states he is an alcoholic and is worried about having to be in the hospital.  Pt had a 24 oz beer this am.

## 2016-11-13 NOTE — ED Provider Notes (Signed)
[facility] Emergency Department Provider Note    None    (approximate)  I have reviewed the triage vital signs and the nursing notes.   HISTORY  Chief Complaint Shortness of Breath and Chest Pain    HPI Darryl Gonzales is a 61 y.o. male history of COPD and one pack per day smoking history presents with 2-3 days of worsening shortness of breath and dry nonproductive cough. States is normally able to cough up phlegm but over the past 2 days is More thick and is unable to cough any up. States she's also been having some chest pain and pressure related or shortness of breath. States he is feeling weak and dizzy and that throughout the day today he's been unable to speak in complete sentences.   Past Medical History:  Diagnosis Date  . Hypercholesteremia   . Hypertension   . PVD (peripheral vascular disease) ([facility])    Family History  Problem Relation Age of Onset  . Multiple myeloma Sister    Past Surgical History:  Procedure Laterality Date  . APPENDECTOMY    . ELBOW SURGERY    . PERIPHERAL VASCULAR CATHETERIZATION Left 01/18/2015   Procedure: Lower Extremity Angiography;  Surgeon: Katha Cabal, MD;  Location: [facility];  Service: Cardiovascular;  Laterality: Left;  . PERIPHERAL VASCULAR CATHETERIZATION  01/18/2015   Procedure: Lower Extremity Intervention;  Surgeon: Katha Cabal, MD;  Location: [facility];  Service: Cardiovascular;;   Patient Active Problem List   Diagnosis Date Noted  . Acute respiratory failure with hypoxia ([place]) 11/13/2016  . COPD with acute exacerbation ([place]) 11/13/2016      Prior to Admission medications   Medication Sig Start Date End Date Taking? Authorizing Provider  albuterol (PROVENTIL HFA;VENTOLIN HFA) 108 (90 BASE) MCG/ACT inhaler Inhale 1 puff into the lungs every 4 (four) hours as needed for wheezing or shortness of breath (every 4-6 hours).   Yes [provider]    Fluticasone-Salmeterol (ADVAIR) 250-50 MCG/DOSE AEPB Inhale 1 puff into the lungs daily.    Yes [provider]  gabapentin (NEURONTIN) 300 MG capsule Take 300 mg by mouth 3 (three) times daily.   Yes [provider]  ibuprofen (ADVIL,MOTRIN) 200 MG tablet Take 200 mg by mouth every 6 (six) hours as needed.   Yes [provider]  tiotropium (SPIRIVA) 18 MCG inhalation capsule Place 18 mcg into inhaler and inhale daily.    Yes [provider]  clopidogrel (PLAVIX) 75 MG tablet Take 1 tablet (75 mg total) by mouth daily. Patient not taking: Reported on 11/13/2016 01/19/15   Schnier, Dolores Lory, MD    Allergies Sulfa antibiotics    Social History Social History  Substance Use Topics  . Smoking status: Current Every Day Smoker    Packs/day: 1.50    Types: Cigarettes  . Smokeless tobacco: Never Used  . Alcohol use No     Comment: occassional drink    Review of Systems Patient denies headaches, rhinorrhea, blurry vision, numbness, shortness of breath, chest pain, edema, cough, abdominal pain, nausea, vomiting, diarrhea, dysuria, fevers, rashes or hallucinations unless otherwise stated above in HPI. ____________________________________________   PHYSICAL EXAM:  VITAL SIGNS: Vitals:   11/13/16 1623  BP: 126/71  Pulse: 87  Resp: (!) 30  Temp: 97.6 F (36.4 C)    Constitutional: Alert and oriented. Moderate respiratory distress Eyes: Conjunctivae are normal.  Head: Atraumatic. Nose: No congestion/rhinnorhea. Mouth/Throat: Mucous membranes are moist.  Neck: No stridor. Painless ROM.  Cardiovascular: Normal rate, regular rhythm. Grossly normal heart sounds.  Good peripheral circulation. Respiratory: tahcypnea, use of accessory muscles, speaking in short phrases, diminished breathsounds throughout Gastrointestinal: Soft and nontender. No distention. No abdominal bruits. No CVA tenderness. Musculoskeletal: No lower extremity tenderness nor  edema.  No joint effusions. Neurologic:  Normal speech and language. No gross focal neurologic deficits are appreciated. No facial droop Skin:  Skin is warm, dry and intact. No rash noted. Psychiatric: Mood and affect are normal. Speech and behavior are normal.  ____________________________________________   LABS (all labs ordered are listed, but only abnormal results are displayed)  Results for orders placed or performed during the hospital encounter of 11/13/16 (from the past 24 hour(s))  Basic metabolic panel     Status: Abnormal   Collection Time: 11/13/16  4:40 PM  Result Value Ref Range   Sodium 134 (L) 135 - 145 mmol/L   Potassium 4.2 3.5 - 5.1 mmol/L   Chloride 101 101 - 111 mmol/L   CO2 22 22 - 32 mmol/L   Glucose, Bld 93 65 - 99 mg/dL   BUN 16 6 - 20 mg/dL   Creatinine, Ser 1.02 0.61 - 1.24 mg/dL   Calcium 8.3 (L) 8.9 - 10.3 mg/dL   GFR calc non Af Amer >60 >60 mL/min   GFR calc Af Amer >60 >60 mL/min   Anion gap 11 5 - 15  CBC     Status: Abnormal   Collection Time: 11/13/16  4:40 PM  Result Value Ref Range   WBC 11.5 (H) 3.8 - 10.6 K/uL   RBC 4.79 4.40 - 5.90 MIL/uL   Hemoglobin 16.2 13.0 - 18.0 g/dL   HCT 49.5 40.0 - 52.0 %   MCV 103.4 (H) 80.0 - 100.0 fL   MCH 33.9 26.0 - 34.0 pg   MCHC 32.8 32.0 - 36.0 g/dL   RDW 15.1 (H) 11.5 - 14.5 %   Platelets 208 150 - 440 K/uL  Troponin I     Status: Abnormal   Collection Time: 11/13/16  4:40 PM  Result Value Ref Range   Troponin I 0.04 (HH) <0.03 ng/mL  Brain natriuretic peptide     Status: Abnormal   Collection Time: 11/13/16  4:40 PM  Result Value Ref Range   B Natriuretic Peptide 377.0 (H) 0.0 - 100.0 pg/mL  Magnesium     Status: None   Collection Time: 11/13/16  4:40 PM  Result Value Ref Range   Magnesium 1.8 1.7 - 2.4 mg/dL   ____________________________________________  EKG My review and personal interpretation at Time: 16:16   Indication: sob  Rate: 85  Rhythm: sinus Axis: normal Other: normal  intervals, no st elevations or depressions ____________________________________________  RADIOLOGY  I personally reviewed all radiographic images ordered to evaluate for the above acute complaints and reviewed radiology reports and findings.  These findings were personally discussed with the patient.  Please see medical record for radiology report.  ____________________________________________   PROCEDURES  Procedure(s) performed:  Procedures    Critical Care performed: yes CRITICAL CARE Performed by: Merlyn Lot   Total critical care time: 35 minutes  Critical care time was exclusive of separately billable procedures and treating other patients.  Critical care was necessary to treat or prevent imminent or life-threatening deterioration.  Critical care was time spent personally by me on the following activities: development of treatment plan with patient and/or surrogate as well as nursing, discussions with consultants, evaluation of patient's response  to treatment, examination of patient, obtaining history from patient or surrogate, ordering and performing treatments and interventions, ordering and review of laboratory studies, ordering and review of radiographic studies, pulse oximetry and re-evaluation of patient's condition.  ____________________________________________   INITIAL IMPRESSION / ASSESSMENT AND PLAN / ED COURSE  Pertinent labs & imaging results that were available during my care of the patient were reviewed by me and considered in my medical decision making (see chart for details).  DDX: Asthma, copd, CHF, pna, ptx, malignancy, Pe, anemia   EZIO WIECK is a 61 y.o. who presents to the ED with shortness of breath and nonproductive cough. Patient arrives in moderate respiratory distress requiring supplemental oxygen.  Exam as above. Currently afebrile but presentation concerning for mixed picture with COPD versus CHF. Less consistent with ACS.will give  nebulizers to assess for improvement.  The patient will be placed on continuous pulse oximetry and telemetry for monitoring.  Laboratory evaluation will be sent to evaluate for the above complaints.     Clinical Course as of Nov 14 1810  Tue Nov 13, 2016  1743 B Natriuretic Peptide: (!) 377.0 [RS]  6333 Patient reassessed. No significant improvement after it neb treatment. Patient placed on BiPAP for increased work of breathing and acute hypoxia or tachypnea.  We'll continue with nebulization treatments and we'll give IV abx given productive cough and elevated leukocytosis.  Patient will require admission to hospital for further evaluation and management and respiratory support as he is currently on BiPAP.  [PR]    Clinical Course User Index [PR] Merlyn Lot, MD [RS] Rosealee Albee, Student-PA     ____________________________________________   FINAL CLINICAL IMPRESSION(S) / ED DIAGNOSES  Final diagnoses:  Acute respiratory failure with hypoxia ([place])  Chronic obstructive pulmonary disease, unspecified COPD type ([place])  Acute pulmonary edema ([place])      NEW MEDICATIONS STARTED DURING THIS VISIT:  New Prescriptions   No medications on file     Note:  This document was prepared using Dragon voice recognition software and may include unintentional dictation errors.    Merlyn Lot, MD 11/13/16 [phone]

## 2016-11-14 ENCOUNTER — Inpatient Hospital Stay: Payer: BLUE CROSS/BLUE SHIELD

## 2016-11-14 DIAGNOSIS — J441 Chronic obstructive pulmonary disease with (acute) exacerbation: Principal | ICD-10-CM

## 2016-11-14 DIAGNOSIS — Z515 Encounter for palliative care: Secondary | ICD-10-CM

## 2016-11-14 DIAGNOSIS — Z7189 Other specified counseling: Secondary | ICD-10-CM

## 2016-11-14 DIAGNOSIS — J9601 Acute respiratory failure with hypoxia: Secondary | ICD-10-CM

## 2016-11-14 DIAGNOSIS — J810 Acute pulmonary edema: Secondary | ICD-10-CM

## 2016-11-14 LAB — BASIC METABOLIC PANEL
Anion gap: 7 (ref 5–15)
BUN: 17 mg/dL (ref 6–20)
CALCIUM: 8.3 mg/dL — AB (ref 8.9–10.3)
CO2: 28 mmol/L (ref 22–32)
CREATININE: 0.93 mg/dL (ref 0.61–1.24)
Chloride: 102 mmol/L (ref 101–111)
GFR calc Af Amer: >60 mL/min (ref >60)
GLUCOSE: 204 mg/dL — AB (ref 65–99)
POTASSIUM: 4.4 mmol/L (ref 3.5–5.1)
SODIUM: 137 mmol/L (ref 135–145)

## 2016-11-14 LAB — GLUCOSE, CAPILLARY
Glucose-Capillary: 223 mg/dL — ABNORMAL HIGH (ref 65–99)
Glucose-Capillary: 145 mg/dL — ABNORMAL HIGH (ref 65–99)

## 2016-11-14 LAB — CBC
HCT: 49.3 % (ref 40.0–52.0)
Hemoglobin: 16.6 g/dL (ref 13.0–18.0)
MCH: 34.4 pg — ABNORMAL HIGH (ref 26.0–34.0)
MCHC: 33.6 g/dL (ref 32.0–36.0)
MCV: 102.5 fL — ABNORMAL HIGH (ref 80.0–100.0)
PLATELETS: 225 10*3/uL (ref 150–440)
RBC: 4.81 MIL/uL (ref 4.40–5.90)
RDW: 14.9 % — AB (ref 11.5–14.5)
WBC: 7 10*3/uL (ref 3.8–10.6)

## 2016-11-14 LAB — ECHOCARDIOGRAM COMPLETE
HEIGHTINCHES: 67 in
WEIGHTICAEL: 2816 [oz_av]

## 2016-11-14 LAB — TROPONIN I

## 2016-11-14 MED ORDER — ALBUTEROL SULFATE (2.5 MG/3ML) 0.083% IN NEBU
2.5000 mg | INHALATION_SOLUTION | RESPIRATORY_TRACT | Status: DC
  Administered 2016-11-14 – 2016-11-18 (×20): 2.5 mg via RESPIRATORY_TRACT
  Filled 2016-11-14 (×21): qty 3

## 2016-11-14 MED ORDER — HYDROCOD POLST-CPM POLST ER 10-8 MG/5ML PO SUER
5.0000 mL | Freq: Two times a day (BID) | ORAL | Status: DC
  Administered 2016-11-14 – 2016-11-18 (×9): 5 mL via ORAL
  Filled 2016-11-14 (×9): qty 5

## 2016-11-14 MED ORDER — INSULIN ASPART 100 UNIT/ML ~~LOC~~ SOLN
0.0000 [IU] | Freq: Three times a day (TID) | SUBCUTANEOUS | Status: DC
  Administered 2016-11-14: 3 [IU] via SUBCUTANEOUS
  Administered 2016-11-15 (×2): 2 [IU] via SUBCUTANEOUS
  Administered 2016-11-15: 17:00:00 1 [IU] via SUBCUTANEOUS
  Administered 2016-11-16: 3 [IU] via SUBCUTANEOUS
  Administered 2016-11-16 – 2016-11-17 (×3): 2 [IU] via SUBCUTANEOUS
  Administered 2016-11-17: 1 [IU] via SUBCUTANEOUS
  Administered 2016-11-17: 2 [IU] via SUBCUTANEOUS
  Administered 2016-11-18: 1 [IU] via SUBCUTANEOUS
  Filled 2016-11-14 (×2): qty 3
  Filled 2016-11-14 (×2): qty 2
  Filled 2016-11-14: qty 3
  Filled 2016-11-14: qty 1
  Filled 2016-11-14: qty 2
  Filled 2016-11-14: qty 1
  Filled 2016-11-14 (×3): qty 2

## 2016-11-14 MED ORDER — NITROGLYCERIN 2 % TD OINT
1.0000 [in_us] | TOPICAL_OINTMENT | Freq: Four times a day (QID) | TRANSDERMAL | Status: DC
  Administered 2016-11-14 – 2016-11-18 (×17): 1 [in_us] via TOPICAL
  Filled 2016-11-14 (×17): qty 1

## 2016-11-14 MED ORDER — GUAIFENESIN ER 600 MG PO TB12
600.0000 mg | ORAL_TABLET | Freq: Two times a day (BID) | ORAL | Status: DC
  Administered 2016-11-14 – 2016-11-18 (×9): 600 mg via ORAL
  Filled 2016-11-14 (×9): qty 1

## 2016-11-14 MED ORDER — METHYLPREDNISOLONE SODIUM SUCC 125 MG IJ SOLR
60.0000 mg | Freq: Three times a day (TID) | INTRAMUSCULAR | Status: DC
  Administered 2016-11-14 – 2016-11-18 (×11): 60 mg via INTRAVENOUS
  Filled 2016-11-14 (×11): qty 2

## 2016-11-14 MED ORDER — ENOXAPARIN SODIUM 40 MG/0.4ML ~~LOC~~ SOLN
40.0000 mg | SUBCUTANEOUS | Status: DC
  Administered 2016-11-14 – 2016-11-17 (×4): 40 mg via SUBCUTANEOUS
  Filled 2016-11-14 (×4): qty 0.4

## 2016-11-14 MED ORDER — ASPIRIN EC 81 MG PO TBEC
81.0000 mg | DELAYED_RELEASE_TABLET | Freq: Every day | ORAL | Status: DC
  Administered 2016-11-14 – 2016-11-18 (×5): 81 mg via ORAL
  Filled 2016-11-14 (×5): qty 1

## 2016-11-14 MED ORDER — INSULIN ASPART 100 UNIT/ML ~~LOC~~ SOLN
0.0000 [IU] | Freq: Every day | SUBCUTANEOUS | Status: DC
Start: 2016-11-14 — End: 2016-11-18

## 2016-11-14 NOTE — Progress Notes (Signed)
[facility] - [place] at [facility]   PATIENT NAME: Darryl ChandlerMichael Gonzales    MR#:  [id number]  DATE OF BIRTH:  1955-06-30  SUBJECTIVE:  CHIEF COMPLAINT:   Chief Complaint  Patient presents with  . Shortness of Breath  . Chest Pain     With past medical history significant for history of hypertension, lipidemia, peripheral vascular disease, who presents to the hospital with complaints of 2-3 day history of shortness of breath and chest tightness, cough, yellowish sputum production. On arrival to the hospital patient was noted to have wheezing, chest x-ray revealed pulmonary edema. He was admitted. He was initiated on Lasix, diuresed approximately 400 cc, still does not feel well, complains of shortness of breath. Denies significant wheezing at present, or sputum production. The patient is not on oxygen at home. He is on 3-4 L of oxygen here in the hospital. Review of Systems  Constitutional: Negative for chills, fever and weight loss.  HENT: Negative for congestion.   Eyes: Negative for blurred vision and double vision.  Respiratory: Positive for cough, sputum production, shortness of breath and wheezing.   Cardiovascular: Negative for chest pain, palpitations, orthopnea, leg swelling and PND.  Gastrointestinal: Negative for abdominal pain, blood in stool, constipation, diarrhea, nausea and vomiting.  Genitourinary: Negative for dysuria, frequency, hematuria and urgency.  Musculoskeletal: Negative for falls.  Neurological: Negative for dizziness, tremors, focal weakness and headaches.  Endo/Heme/Allergies: Does not bruise/bleed easily.  Psychiatric/Behavioral: Negative for depression. The patient does not have insomnia.     VITAL SIGNS: Blood pressure 138/68, pulse 78, temperature 97.6 F (36.4 C), temperature source Oral, resp. rate (!) 22, height 5\' 7"  (1.702 m), weight 81.4 kg (179 lb 6.4 oz), SpO2 93 %.  PHYSICAL EXAMINATION:   GENERAL:  61 y.o.-year-old  patient lying in the bed in mild-to-moderate respiratory distress, tachypnea, intermittently coughing, and comfortable.  EYES: Pupils equal, round, reactive to light and accommodation. No scleral icterus. Extraocular muscles intact.  HEENT: Head atraumatic, normocephalic. Oropharynx and nasopharynx clear.  NECK:  Supple, no jugular venous distention. No thyroid enlargement, no tenderness.  LUNGS: Barrel  chest. Some diminished breath sounds bilaterally, no wheezing, rales,rhonchi or crepitation. Intermittent use of accessory muscles of respiration, but his movements, speech.  CARDIOVASCULAR: S1, S2 normal. No murmurs, rubs, or gallops.  ABDOMEN: Soft, nontender, nondistended. Bowel sounds present. No organomegaly or mass.  EXTREMITIES: No pedal edema, cyanosis, or clubbing.  NEUROLOGIC: Cranial nerves II through XII are intact. Muscle strength 5/5 in all extremities. Sensation intact. Gait not checked.  PSYCHIATRIC: The patient is alert and oriented x 3.  SKIN: No obvious rash, lesion, or ulcer.   ORDERS/RESULTS REVIEWED:   CBC  Recent Labs Lab 11/13/16 1640 11/14/16 0453  WBC 11.5* 7.0  HGB 16.2 16.6  HCT 49.5 49.3  PLT 208 225  MCV 103.4* 102.5*  MCH 33.9 34.4*  MCHC 32.8 33.6  RDW 15.1* 14.9*   ------------------------------------------------------------------------------------------------------------------  Chemistries   Recent Labs Lab 11/13/16 1640 11/14/16 0453  NA 134* 137  K 4.2 4.4  CL 101 102  CO2 22 28  GLUCOSE 93 204*  BUN 16 17  CREATININE 1.02 0.93  CALCIUM 8.3* 8.3*  MG 1.8  --    ------------------------------------------------------------------------------------------------------------------ estimated creatinine clearance is 83 mL/min (by C-G formula based on SCr of 0.93 mg/dL). ------------------------------------------------------------------------------------------------------------------ No results for input(s): TSH, T4TOTAL, T3FREE, THYROIDAB  in the last 72 hours.  Invalid input(s): FREET3  Cardiac Enzymes  Recent Labs Lab 11/13/16 1640  TROPONINI 0.04*   ------------------------------------------------------------------------------------------------------------------ Invalid input(s): POCBNP ---------------------------------------------------------------------------------------------------------------  RADIOLOGY: Dg Chest 2 View  Result Date: 11/14/2016 CLINICAL DATA:  Shortness of breath and chest tightness with some productive cough and chills. Wheezing on exam. History of COPD. Current smoker. EXAM: CHEST  2 VIEW COMPARISON:  Chest x-ray of November 13, 2016 FINDINGS: The lungs are hyperinflated. The interstitial markings are increased bilaterally. Increased density at the right lung base is consistent with acute atelectasis or infiltrate likely in the lingula. The heart and pulmonary vascularity are normal. There is calcification in the wall of the aortic arch. The trachea is midline. The bony thorax exhibits no acute abnormality. IMPRESSION: COPD. Chronic interstitial prominence with superimposed subsegmental atelectasis or acute pneumonia in the lingula. No pulmonary edema. Followup PA and lateral chest X-ray is recommended in 3-4 weeks following trial of antibiotic therapy to ensure resolution and exclude underlying malignancy. Thoracic aortic atherosclerosis. Electronically Signed   By: David  [place] M.D.   On: 11/14/2016 08:00   Dg Chest 2 View  Result Date: 11/13/2016 CLINICAL DATA:  Shortness of breath and cough EXAM: CHEST  2 VIEW COMPARISON:  March 30, 2013 FINDINGS: There is mild interstitial edema. There is no airspace consolidation or volume loss. Heart is slightly enlarged with pulmonary venous hypertension. There is no evident adenopathy. There is aortic atherosclerosis. There is degenerative change in the left and right acromioclavicular joints. IMPRESSION: Findings indicative of a degree of congestive heart  failure. No airspace consolidation. There is aortic atherosclerosis. Electronically Signed   By: Bretta Bang III M.D.   On: 11/13/2016 17:07    EKG:  Orders placed or performed during the hospital encounter of 11/13/16  . ED EKG within 10 minutes  . ED EKG within 10 minutes  . EKG 12-Lead  . EKG 12-Lead    ASSESSMENT AND PLAN:  Principal Problem:   Acute respiratory failure with hypoxia (HCC) Active Problems:   COPD with acute exacerbation (HCC)   Acute respiratory failure (HCC)  #1. Acute respiratory failure with hypoxia, continue oxygen therapy, weaning off oxygen as tolerated, now on 3 L, not on oxygen at home #2. Acute diastolic CHF, continue diuretic, following in's and outs, weight #3. COPD exacerbation due to acute bronchitis, continue patient on Zithromax, get sputum culture if possible, continue steroids, adding budesonide inhaler, continue duo nebs, adding Tussionex and Mucinex, following clinically #4. Hyponatremia, resolved #5. Leukocytosis, resolved #6. Hyperglycemia, likely due to steroids, get hemoglobin A1c #7. Elevated troponin, likely demand ischemia, check troponin, echocardiogram is unremarkable, adding nitroglycerin topically and aspirin, get cardiologist consultation as outpatient, but he likely will need stress test as outpatient  Management plans discussed with the patient, family and they are in agreement.   DRUG ALLERGIES:  Allergies  Allergen Reactions  . Sulfa Antibiotics Rash    CODE STATUS:     Code Status Orders        Start     Ordered   11/13/16 2024  Do not attempt resuscitation (DNR)  Continuous    Question Answer Comment  In the event of cardiac or respiratory ARREST Do not call a "code blue"   In the event of cardiac or respiratory ARREST Do not perform Intubation, CPR, defibrillation or ACLS   In the event of cardiac or respiratory ARREST Use medication by any route, position, wound care, and other measures to relive pain and  suffering. May use oxygen, suction and manual treatment of airway obstruction as needed for comfort.   Comments confirmed  with pt.      11/13/16 2023    Code Status History    Date Active Date Inactive Code Status Order ID Comments User Context   01/18/2015 12:12 PM 01/18/2015  4:48 PM Full Code [id number]  Schnier, Latina Craver, MD Inpatient      TOTAL TIME TAKING CARE OF THIS PATIENT: 40 minutes.    Katharina Caper M.D on 11/14/2016 at 12:08 PM  Between 7am to 6pm - Pager - [phone]  After 6pm go to [email or web address] - password [email or web address]  [facility]  Office  [phone]  CC: Primary care physician; Patient, No Pcp Per

## 2016-11-14 NOTE — Consult Note (Signed)
Consultation Note Date: 11/14/2016   Patient Name: Darryl Gonzales  DOB: Mar 11, 1956  MRN: [id number]  Age / Sex: 61 y.o., male  PCP: Patient, No Pcp Per Referring Physician: Theodoro Grist, MD  Reason for Consultation: Establish GOC and emotional support  HPI/Patient Profile: 61 y.o. male   admitted on 11/13/2016 with a known history of Hypercholesterolemia, hypertension, peripheral vascular disease- for last 2-3 days feeling short of breath and chest tightness with  associated productive cough for yellowish sputum.   In the  emergency room he is found with  pulmonary edema,  active wheezing with COPD exacerbation.  He is admitted for treatment and stabilization.   Clinical Assessment and Goals of Care:  This NP Darryl Gonzales reviewed medical records, received report from team, assessed the patient and then meet at the patient's bedside to discuss diagnosis, prognosis, GOC,  and options.  A discussion was had today regarding advanced directives.  Concepts specific to code status was had.    Values and goals of care important to patient and family were attempted to be elicited.  Concept of Palliative Care was discussed    Questions and concerns addressed.   Patient encouraged to call with questions or concerns.  PMT will continue to support holistically.   OTHER/ pateint tells me he would like his brother Darryl Gonzales to be his decision maker in case he cannot make his own decsions    SUMMARY OF RECOMMENDATIONS    Code Status/Advance Care Planning:  DNR- long discussion with patient regarding DNR status.  His daughter has been visibly upset this afternoon and trying to convince him to change his code status to Full Code  Darryl Gonzales verbalizes concern related to his daughters emotional state   Patient remains steadfast in his decision to remain a DNR/DNI, however he wants the bracket removed and his  password reinforced so his daughter cannot receive information about his healthcare decisions.  Any discussion regarding his care plan needs to be deferred directly to patient    Additional Recommendations (Limitations, Scope, Preferences):  Full Scope Treatment  Psycho-social/Spiritual:   Desire for further Chaplaincy support:no Long discussion with patitn regarding his concern for his daughter Darryl Gonzales, she is having a hard time understanding his illness and likley long term poor prognosis within context of his lifestyle  Patient admits to tobacco and alcohol abuse  Prognosis:   Unable to determine  Discharge Planning: To Be Determined      Primary Diagnoses: Present on Admission: . Acute respiratory failure (Darryl Gonzales)   I have reviewed the medical record, interviewed the patient and family, and examined the patient. The following aspects are pertinent.  Past Medical History:  Diagnosis Date  . Hypercholesteremia   . Hypertension   . PVD (peripheral vascular disease) ([place])    Social History   Social History  . Marital status: Single    Spouse name: N/A  . Number of children: N/A  . Years of education: N/A   Social History Main Topics  . Smoking status: Current  Every Day Smoker    Packs/day: 1.50    Types: Cigarettes  . Smokeless tobacco: Never Used  . Alcohol use No     Comment: occassional drink  . Drug use: No  . Sexual activity: Not Asked   Other Topics Concern  . None   Social History Narrative  . None   Family History  Problem Relation Age of Onset  . Multiple myeloma Sister    Scheduled Meds: . albuterol  2.5 mg Nebulization Q4H  . aspirin EC  81 mg Oral Daily  . budesonide (PULMICORT) nebulizer solution  0.25 mg Nebulization BID  . chlorpheniramine-HYDROcodone  5 mL Oral Q12H  . enoxaparin (LOVENOX) injection  40 mg Subcutaneous Q24H  . folic acid  1 mg Oral Daily  . furosemide  20 mg Intravenous Q12H  . gabapentin  300 mg Oral TID  .  guaiFENesin  600 mg Oral BID  . insulin aspart  0-5 Units Subcutaneous QHS  . insulin aspart  0-9 Units Subcutaneous TID WC  . methylPREDNISolone (SOLU-MEDROL) injection  60 mg Intravenous Q8H  . multivitamin with minerals  1 tablet Oral Daily  . nicotine  21 mg Transdermal Daily  . nitroGLYCERIN  1 inch Topical Q6H  . thiamine  100 mg Oral Daily   Or  . thiamine  100 mg Intravenous Daily  . tiotropium  18 mcg Inhalation Daily   Continuous Infusions: . azithromycin     PRN Meds:.docusate sodium, ibuprofen, LORazepam **OR** LORazepam Medications Prior to Admission:  Prior to Admission medications   Medication Sig Start Date End Date Taking? Authorizing Provider  albuterol (PROVENTIL HFA;VENTOLIN HFA) 108 (90 BASE) MCG/ACT inhaler Inhale 1 puff into the lungs every 4 (four) hours as needed for wheezing or shortness of breath (every 4-6 hours).   Yes [provider]  Fluticasone-Salmeterol (ADVAIR) 250-50 MCG/DOSE AEPB Inhale 1 puff into the lungs daily.    Yes [provider]  gabapentin (NEURONTIN) 300 MG capsule Take 300 mg by mouth 3 (three) times daily.   Yes [provider]  ibuprofen (ADVIL,MOTRIN) 200 MG tablet Take 200 mg by mouth every 6 (six) hours as needed.   Yes [provider]  tiotropium (SPIRIVA) 18 MCG inhalation capsule Place 18 mcg into inhaler and inhale daily.    Yes [provider]  clopidogrel (PLAVIX) 75 MG tablet Take 1 tablet (75 mg total) by mouth daily. Patient not taking: Reported on 11/13/2016 01/19/15   Schnier, Dolores Lory, MD   Allergies  Allergen Reactions  . Sulfa Antibiotics Rash   Review of Systems  Constitutional: Positive for fatigue.  Respiratory: Positive for cough and shortness of breath.   Neurological: Positive for weakness.    Physical Exam  Constitutional: He is oriented to person, place, and time. He appears well-developed. He is cooperative. He appears ill. Nasal cannula in place.    Cardiovascular: Normal rate, regular rhythm and normal heart sounds.   Pulmonary/Chest: Accessory muscle usage present. Tachypnea noted. He has decreased breath sounds.  Neurological: He is alert and oriented to person, place, and time.  Skin: Skin is warm and dry.    Vital Signs: BP 126/70 (BP Location: Left Arm)   Pulse 91   Temp 97.5 F (36.4 C) (Oral)   Resp 18   Ht 5' 7"  (1.702 m)   Wt 81.4 kg (179 lb 6.4 oz)   SpO2 92%   BMI 28.10 kg/m  Pain Assessment: Faces   Pain Score: 0-No pain  SpO2: SpO2: 92 % O2 Device:SpO2: 92 % O2 Flow Rate: .O2 Flow Rate (L/min): 4 L/min  IO: Intake/output summary:  Intake/Output Summary (Last 24 hours) at 11/14/16 1547 Last data filed at 11/14/16 0800  Gross per 24 hour  Intake                0 ml  Output              400 ml  Net             -400 ml    LBM: Last BM Date: 11/12/16 Baseline Weight: Weight: 79.8 kg (176 lb) Most recent weight: Weight: 81.4 kg (179 lb 6.4 oz)      Palliative Assessment/Data: 60%   Discussed with Dr Ether Griffins  Time In: 1500 Time Out: 1615 Time Total: 75 min Greater than 50%  of this time was spent counseling and coordinating care related to the above assessment and plan.  Signed by: Darryl Lessen, NP   Please contact Palliative Medicine Team phone at [phone] for questions and concerns.  For individual provider: See Shea Evans

## 2016-11-14 NOTE — Progress Notes (Signed)
Inpatient Diabetes Program Recommendations  AACE/ADA: New Consensus Statement on Inpatient Glycemic Control (2015)  Target Ranges:  Prepandial:   less than 140 mg/dL      Peak postprandial:   less than 180 mg/dL (1-2 hours)      Critically ill patients:  140 - 180 mg/dL   Results for Darryl Gonzales, [name] (MRN [id number]) as of 11/14/2016 08:47  Ref. Range 11/14/2016 04:53  Glucose Latest Ref Range: 65 - 99 mg/dL 409204 (H)    Admit: Acute respiratory failure with hypoxia/ Acute COPD exacerbation/ Acute congestive heart failure/ Acute bronchitis  No History of DM noted in H&P.  Patient currently getting Solumedrol 60 mg Q6 hours.  Lab glucose elevated to 204 mg/dl this AM.     MD- Please consider placing orders for Novolog Sensitive Correction Scale/ SSI (0-9 units) TID AC + HS while patient receiving steroids in hospital      --Will follow patient during hospitalization--  [name] RN, MSN, CDE Diabetes Coordinator Inpatient Glycemic Control Team Team Pager: [phone] (8a-5p)

## 2016-11-15 DIAGNOSIS — J810 Acute pulmonary edema: Secondary | ICD-10-CM

## 2016-11-15 DIAGNOSIS — Z515 Encounter for palliative care: Secondary | ICD-10-CM

## 2016-11-15 DIAGNOSIS — Z7189 Other specified counseling: Secondary | ICD-10-CM

## 2016-11-15 LAB — GLUCOSE, CAPILLARY
GLUCOSE-CAPILLARY: 156 mg/dL — AB (ref 65–99)
GLUCOSE-CAPILLARY: 174 mg/dL — AB (ref 65–99)
Glucose-Capillary: 129 mg/dL — ABNORMAL HIGH (ref 65–99)
Glucose-Capillary: 165 mg/dL — ABNORMAL HIGH (ref 65–99)

## 2016-11-15 LAB — HIV ANTIBODY (ROUTINE TESTING W REFLEX): HIV Screen 4th Generation wRfx: NONREACTIVE

## 2016-11-15 LAB — HEMOGLOBIN A1C
Hgb A1c MFr Bld: 6 % — ABNORMAL HIGH (ref 4.8–5.6)
MEAN PLASMA GLUCOSE: 126 mg/dL

## 2016-11-15 MED ORDER — BUDESONIDE 0.5 MG/2ML IN SUSP
0.5000 mg | Freq: Two times a day (BID) | RESPIRATORY_TRACT | Status: DC
  Administered 2016-11-15 – 2016-11-18 (×7): 0.5 mg via RESPIRATORY_TRACT
  Filled 2016-11-15 (×6): qty 2

## 2016-11-15 MED ORDER — DEXTROSE 5 % IV SOLN
1.0000 g | INTRAVENOUS | Status: DC
  Administered 2016-11-15 – 2016-11-18 (×4): 1 g via INTRAVENOUS
  Filled 2016-11-15 (×5): qty 10

## 2016-11-15 NOTE — Discharge Instructions (Signed)
Heart Failure Clinic appointment on November 28, 2016 at 1:00pm with Clarisa Kindredina Ubah Radke, FNP. Please call [phone] to reschedule.

## 2016-11-15 NOTE — Progress Notes (Signed)
Date: 11/15/2016,   MRN# [id number] Darryl Gonzales 1956-01-02 Code Status:     Code Status Orders        Start     Ordered   11/13/16 2024  Do not attempt resuscitation (DNR)  Continuous    Question Answer Comment  In the event of cardiac or respiratory ARREST Do not call a "code blue"   In the event of cardiac or respiratory ARREST Do not perform Intubation, CPR, defibrillation or ACLS   In the event of cardiac or respiratory ARREST Use medication by any route, position, wound care, and other measures to relive pain and suffering. May use oxygen, suction and manual treatment of airway obstruction as needed for comfort.   Comments confirmed with pt.      11/13/16 2023    Code Status History    Date Active Date Inactive Code Status Order ID Comments User Context   01/18/2015 12:12 PM 01/18/2015  4:48 PM Full Code [id number]  Schnier, Dolores Lory, MD Inpatient     Hosp day:_0 @ Referring MD: _1 @      CC: copd exacerbation  HPI: this is a smoker, etoh abuser, known hx of copd, comes in with increase sob, coughing and chest tightness. In the ER he was edematous and deemed in copd exacerbation. Hence was admitted. pulmonary consulted for copd exacerbation. Daughter is in the room.    PMHX:   Past Medical History:  Diagnosis Date  . Hypercholesteremia   . Hypertension   . PVD (peripheral vascular disease) ([place])    Surgical Hx:  Past Surgical History:  Procedure Laterality Date  . APPENDECTOMY    . ELBOW SURGERY    . PERIPHERAL VASCULAR CATHETERIZATION Left 01/18/2015   Procedure: Lower Extremity Angiography;  Surgeon: Katha Cabal, MD;  Location: [place] [facility];  Service: Cardiovascular;  Laterality: Left;  . PERIPHERAL VASCULAR CATHETERIZATION  01/18/2015   Procedure: Lower Extremity Intervention;  Surgeon: Katha Cabal, MD;  Location: [place] [facility];  Service: Cardiovascular;;   Family Hx:  Family History  Problem Relation Age of Onset  .  Multiple myeloma Sister    Social Hx:   Social History  Substance Use Topics  . Smoking status: Current Every Day Smoker    Packs/day: 1.50    Types: Cigarettes  . Smokeless tobacco: Never Used  . Alcohol use No     Comment: occassional drink   Medication:    Home Medication:    Current Medication: _2 @   Allergies:  Sulfa antibiotics  Review of Systems: Gen:  Denies  fever, sweats, chills HEENT: Denies blurred vision, double vision, ear pain, eye pain, hearing loss, nose bleeds, sore throat Cvc:  No dizziness, chest pain or heaviness Resp:  less sob, less cough  Gi: Denies swallowing difficulty, stomach pain, nausea or vomiting, diarrhea, constipation, bowel incontinence Gu:  Denies bladder incontinence, burning urine Ext:   No Joint pain, stiffness or swelling Skin: No skin rash, easy bruising or bleeding or hives Endoc:  No polyuria, polydipsia , polyphagia or weight change Psych: No depression, insomnia or hallucinations  Other:  All other systems negative  Physical Examination:   VS: BP 138/84 (BP Location: Left Arm)   Pulse 94   Temp 97.8 F (36.6 C) (Oral)   Resp 20   Ht 5' 7" (1.702 m)   Wt 178 lb 11.2 oz (81.1 kg)   SpO2 94%   BMI 27.99 kg/m   General Appearance: No distress  Neuro: without  focal findings, mental status, speech normal, alert and oriented, cranial nerves 2-12 intact, reflexes normal and symmetric, sensation grossly normal  HEENT: PERRLA, EOM intact, no ptosis, no other lesions noticed NECK: Supple, no stridor Pulmonary: decrease bs, .No wheezing, No rales    Cardiovascular:  Normal S1,S2.  No m/r/g.   Abdomen:Benign, Soft, non-tender, No masses, hepatosplenomegaly, No lymphadenopathy Endoc: No evident thyromegaly, no signs of acromegaly or Cushing features Skin:   warm, no rashes, no ecchymosis  Extremities: normal, no cyanosis, clubbing, no edema, warm with normal capillary refill.    Labs results:   Recent Labs      11/13/16  1640  11/14/16  0453  HGB  16.2  16.6  HCT  49.5  49.3  MCV  103.4*  102.5*  WBC  11.5*  7.0  BUN  16  17  CREATININE  1.02  0.93  GLUCOSE  93  204*  CALCIUM  8.3*  8.3*  ,     Rad results:  Study Result   CLINICAL DATA:  Shortness of breath and chest tightness with some productive cough and chills. Wheezing on exam. History of COPD. Current smoker.  EXAM: CHEST  2 VIEW  COMPARISON:  Chest x-ray of November 13, 2016  FINDINGS: The lungs are hyperinflated. The interstitial markings are increased bilaterally. Increased density at the right lung base is consistent with acute atelectasis or infiltrate likely in the lingula. The heart and pulmonary vascularity are normal. There is calcification in the wall of the aortic arch. The trachea is midline. The bony thorax exhibits no acute abnormality.  IMPRESSION: COPD. Chronic interstitial prominence with superimposed subsegmental atelectasis or acute pneumonia in the lingula. No pulmonary edema. Followup PA and lateral chest X-ray is recommended in 3-4 weeks following trial of antibiotic therapy to ensure resolution and exclude underlying malignancy.  Thoracic aortic atherosclerosis.   Electronically Signed   By: David  Jordan M.D.   On: 11/14/2016 08:00       Assessment and Plan: 1. COPD stage II,  Nicotine addiction, recovering from copd exacerbation 2. Alcohol abuse, at risk of withdrawal, on DT prophylaxis 3. Diastolic failure  Plan 1. Agree with present copd regimen. 2. Stay off cigarettes and etoh 3. duiresis 4. DT prophyxis 5. dvt  Prophylaxis 6. Following 7. Out pt f/u in 1-2 weeks      I have personally obtained a history, examined the patient, evaluated laboratory and imaging results, formulated the assessment and plan and placed orders.  The Patient requires high complexity decision making for assessment and support, frequent evaluation and titration of therapies, application  of advanced monitoring technologies and extensive interpretation of multiple databases.   Herbon Fleming,M.D. Pulmonary & Critical care Medicine [facility]    

## 2016-11-15 NOTE — Progress Notes (Signed)
[facility] - [place] at [facility]   PATIENT NAME: Darryl Gonzales    MR#:  [id number]  DATE OF BIRTH:  1955/07/09  SUBJECTIVE:  CHIEF COMPLAINT:   Chief Complaint  Patient presents with  . Shortness of Breath  . Chest Pain     With past medical history significant for history of hypertension, lipidemia, peripheral vascular disease, who presents to the hospital with complaints of 2-3 day history of shortness of breath and chest tightness, cough, yellowish sputum production. On arrival to the hospital patient was noted to have wheezing, chest x-ray revealed pulmonary edema. He was admitted. He was initiated on Lasix, diuresed but not noted total output amount, still does not feel well, complains of shortness of breath.  The patient is not on oxygen at home. He is on 3-4 L of oxygen here in the hospital.  have significant wheezing today.  Review of Systems  Constitutional: Negative for chills, fever and weight loss.  HENT: Negative for congestion.   Eyes: Negative for blurred vision and double vision.  Respiratory: Positive for cough, sputum production, shortness of breath and wheezing.   Cardiovascular: Negative for chest pain, palpitations, orthopnea, leg swelling and PND.  Gastrointestinal: Negative for abdominal pain, blood in stool, constipation, diarrhea, nausea and vomiting.  Genitourinary: Negative for dysuria, frequency, hematuria and urgency.  Musculoskeletal: Negative for falls.  Neurological: Negative for dizziness, tremors, focal weakness and headaches.  Endo/Heme/Allergies: Does not bruise/bleed easily.  Psychiatric/Behavioral: Negative for depression. The patient does not have insomnia.     VITAL SIGNS: Blood pressure 126/69, pulse 77, temperature 98 F (36.7 C), resp. rate 20, height 5\' 7"  (1.702 m), weight 81.1 kg (178 lb 11.2 oz), SpO2 98 %.  PHYSICAL EXAMINATION:   GENERAL:  61 y.o.-year-old patient lying in the bed in mild-to-moderate  respiratory distress, tachypnea, intermittently coughing, and comfortable.  EYES: Pupils equal, round, reactive to light and accommodation. No scleral icterus. Extraocular muscles intact.  HEENT: Head atraumatic, normocephalic. Oropharynx and nasopharynx clear.  NECK:  Supple, no jugular venous distention. No thyroid enlargement, no tenderness.  LUNGS: Barrel  chest. Some diminished breath sounds bilaterally, no wheezing, rales,rhonchi or crepitation. Intermittent use of accessory muscles of respiration, but his movements, speech.  CARDIOVASCULAR: S1, S2 normal. No murmurs, rubs, or gallops.  ABDOMEN: Soft, nontender, nondistended. Bowel sounds present. No organomegaly or mass.  EXTREMITIES: No pedal edema, cyanosis, or clubbing.  NEUROLOGIC: Cranial nerves II through XII are intact. Muscle strength 5/5 in all extremities. Sensation intact. Gait not checked.  PSYCHIATRIC: The patient is alert and oriented x 3.  SKIN: No obvious rash, lesion, or ulcer.   ORDERS/RESULTS REVIEWED:   CBC  Recent Labs Lab 11/13/16 1640 11/14/16 0453  WBC 11.5* 7.0  HGB 16.2 16.6  HCT 49.5 49.3  PLT 208 225  MCV 103.4* 102.5*  MCH 33.9 34.4*  MCHC 32.8 33.6  RDW 15.1* 14.9*   ------------------------------------------------------------------------------------------------------------------  Chemistries   Recent Labs Lab 11/13/16 1640 11/14/16 0453  NA 134* 137  K 4.2 4.4  CL 101 102  CO2 22 28  GLUCOSE 93 204*  BUN 16 17  CREATININE 1.02 0.93  CALCIUM 8.3* 8.3*  MG 1.8  --    ------------------------------------------------------------------------------------------------------------------ estimated creatinine clearance is 82.9 mL/min (by C-G formula based on SCr of 0.93 mg/dL). ------------------------------------------------------------------------------------------------------------------ No results for input(s): TSH, T4TOTAL, T3FREE, THYROIDAB in the last 72 hours.  Invalid input(s):  FREET3  Cardiac Enzymes  Recent Labs Lab 11/13/16 1640 11/14/16 0453  TROPONINI 0.04* <0.03   ------------------------------------------------------------------------------------------------------------------ Invalid input(s): POCBNP ---------------------------------------------------------------------------------------------------------------  RADIOLOGY: Dg Chest 2 View  Result Date: 11/14/2016 CLINICAL DATA:  Shortness of breath and chest tightness with some productive cough and chills. Wheezing on exam. History of COPD. Current smoker. EXAM: CHEST  2 VIEW COMPARISON:  Chest x-ray of November 13, 2016 FINDINGS: The lungs are hyperinflated. The interstitial markings are increased bilaterally. Increased density at the right lung base is consistent with acute atelectasis or infiltrate likely in the lingula. The heart and pulmonary vascularity are normal. There is calcification in the wall of the aortic arch. The trachea is midline. The bony thorax exhibits no acute abnormality. IMPRESSION: COPD. Chronic interstitial prominence with superimposed subsegmental atelectasis or acute pneumonia in the lingula. No pulmonary edema. Followup PA and lateral chest X-ray is recommended in 3-4 weeks following trial of antibiotic therapy to ensure resolution and exclude underlying malignancy. Thoracic aortic atherosclerosis. Electronically Signed   By: David  [place] M.D.   On: 11/14/2016 08:00   Dg Chest 2 View  Result Date: 11/13/2016 CLINICAL DATA:  Shortness of breath and cough EXAM: CHEST  2 VIEW COMPARISON:  March 30, 2013 FINDINGS: There is mild interstitial edema. There is no airspace consolidation or volume loss. Heart is slightly enlarged with pulmonary venous hypertension. There is no evident adenopathy. There is aortic atherosclerosis. There is degenerative change in the left and right acromioclavicular joints. IMPRESSION: Findings indicative of a degree of congestive heart failure. No airspace  consolidation. There is aortic atherosclerosis. Electronically Signed   By: Bretta Bang III M.D.   On: 11/13/2016 17:07    EKG:  Orders placed or performed during the hospital encounter of 11/13/16  . ED EKG within 10 minutes  . ED EKG within 10 minutes  . EKG 12-Lead  . EKG 12-Lead    ASSESSMENT AND PLAN:  Principal Problem:   Acute respiratory failure with hypoxia (HCC) Active Problems:   COPD with acute exacerbation (HCC)   Acute respiratory failure (HCC)   DNR (do not resuscitate) discussion   Palliative care by specialist   Acute pulmonary edema (HCC)  #1. Acute respiratory failure with hypoxia, continue oxygen therapy, weaning off oxygen as tolerated, now on 3 L, not on oxygen at home, may need oxygen on discharge, still on 3-4 ltr, instructed to taper down to 2 ltr if he can tolerate. #2. Acute diastolic CHF, continue diuretic, following in's and outs, weight- advised for strict I/O #3. COPD exacerbation due to acute bronchitis, continue patient on Zithromax, get sputum culture if possible, continue steroids, increased dose of budesonide inhaler, continue duo nebs, adding Tussionex and Mucinex, following clinically  Added ceftriaxone for bronchitis. #4. Hyponatremia, resolved #5. Leukocytosis, resolved #6. Hyperglycemia, likely due to steroids, get hemoglobin A1c #7. Elevated troponin, likely demand ischemia, check troponin, echocardiogram is unremarkable, adding nitroglycerin topically and aspirin, get cardiologist consultation as outpatient, but he likely will need stress test as outpatient  Management plans discussed with the patient, family and they are in agreement.   DRUG ALLERGIES:  Allergies  Allergen Reactions  . Sulfa Antibiotics Rash    CODE STATUS:     Code Status Orders        Start     Ordered   11/13/16 2024  Do not attempt resuscitation (DNR)  Continuous    Question Answer Comment  In the event of cardiac or respiratory ARREST Do not call  a "code blue"   In the event of cardiac or respiratory ARREST Do  not perform Intubation, CPR, defibrillation or ACLS   In the event of cardiac or respiratory ARREST Use medication by any route, position, wound care, and other measures to relive pain and suffering. May use oxygen, suction and manual treatment of airway obstruction as needed for comfort.   Comments confirmed with pt.      11/13/16 2023    Code Status History    Date Active Date Inactive Code Status Order ID Comments User Context   01/18/2015 12:12 PM 01/18/2015  4:48 PM Full Code [id number]  Schnier, Latina CraverGregory G, MD Inpatient      TOTAL TIME TAKING CARE OF THIS PATIENT: 40 minutes.    Altamese DillingVACHHANI, Jazelle Achey M.D on 11/15/2016 at 7:45 AM  Between 7am to 6pm - Pager - [phone]  After 6pm go to [email or web address] - password [email or web address]  Fabio Neighbors[facility] [place] [facility]  Office  [phone]  CC: Primary care physician; Patient, No Pcp Per

## 2016-11-15 NOTE — Plan of Care (Signed)
Problem: Activity: Goal: Ability to tolerate increased activity will improve Outcome: Progressing Pt ambulating to BR well as needed and sitting up in the chair.

## 2016-11-15 NOTE — Progress Notes (Signed)
Patient ID: Darryl BondMichael F Gonzales, male   DOB: 04-13-1956, 61 y.o.   MRN: [id number]  This NP visited patient at the bedside as a follow up to  yesterday's GOCs meeting.  Patient remains dyspneic at rest, "I'm not ready to go home"    Discussed with  Dr Elisabeth PigeonVachhani  consult to patient's OP pulmonologist Dr Mayo AoFlemming for collaboration of care.  Referral made.  We discussed patient responsibility for healthcare and life style choices ( continued smoking and drinking)  and its effects on long term prognosis.      Discussed with patient the importance of continued conversation with family and their  medical providers regarding overall plan of care and treatment options,  ensuring decisions are within the context of the patients values and GOCs.  Encouraged documentation of HPOA and AD    Questions and concerns addressed  PMT will continue to support holistically   Discussed with Dr Elisabeth PigeonVachhani  Time in 1010          Time out  1030           Total time spent on the unit 20 minutes  Greater than 50% of the time was spent in counseling and coordination of care  Darryl CreedMary Micala Saltsman NP  Palliative Medicine Team Team Phone # [phone] Pager [phone]

## 2016-11-16 LAB — GLUCOSE, CAPILLARY
GLUCOSE-CAPILLARY: 159 mg/dL — AB (ref 65–99)
Glucose-Capillary: 160 mg/dL — ABNORMAL HIGH (ref 65–99)
Glucose-Capillary: 234 mg/dL — ABNORMAL HIGH (ref 65–99)
Glucose-Capillary: 180 mg/dL — ABNORMAL HIGH (ref 65–99)

## 2016-11-16 MED ORDER — SENNOSIDES-DOCUSATE SODIUM 8.6-50 MG PO TABS
1.0000 | ORAL_TABLET | Freq: Two times a day (BID) | ORAL | Status: DC
  Administered 2016-11-16 – 2016-11-17 (×3): 1 via ORAL
  Filled 2016-11-16 (×3): qty 1

## 2016-11-16 MED ORDER — AZITHROMYCIN 250 MG PO TABS
250.0000 mg | ORAL_TABLET | Freq: Every day | ORAL | Status: DC
  Administered 2016-11-16 – 2016-11-17 (×2): 250 mg via ORAL
  Filled 2016-11-16 (×2): qty 1

## 2016-11-16 NOTE — Care Management (Signed)
Admitted to [facility] with the diagnosis of acute respiratory failure. Lives alone. Step brother is Elsie RaHarold Gonzales [phone]). Takes care of all basic and instrumental activities of daily living himself, drives. Still employed and has [employer] and [employer] insurance.  States he hasn't has a personal care physician in 10-15 years, but does see Dr, Meredeth IdeFleming for respiratory problems. [facility] [place]. No medical equipment in the home. No falls. Good appetite. Step brother will transport. May need home oxygen and nebulizer at discharge.  Gwenette GreetBrenda S Jasslyn Finkel RN MSN CCM Care Management [phone]

## 2016-11-16 NOTE — Progress Notes (Signed)
PHARMACIST - PHYSICIAN COMMUNICATION  CONCERNING: Antibiotic IV to Oral Route Change Policy  RECOMMENDATION: This patient is receiving AZITHROMYCIN by the intravenous route.  Based on criteria approved by the Pharmacy and Therapeutics Committee, the antibiotic(s) is/are being converted to the equivalent oral dose form(s).   DESCRIPTION: These criteria include:  Patient being treated for a respiratory tract infection, urinary tract infection, cellulitis or clostridium difficile associated diarrhea if on metronidazole  The patient is not neutropenic and does not exhibit a GI malabsorption state  The patient is eating (either orally or via tube) and/or has been taking other orally administered medications for a least 24 hours  The patient is improving clinically and has a Tmax < 100.5  If you have questions about this conversion, please contact the Pharmacy Department  []   [phone]( [phone] )  [facility] [x]   [phone]( [phone] )  [facility] []   [phone]( [phone] )  [facility] []   [phone]( [phone]  [facility] []   [phone]( [phone] )  [facility]   Bari MantisKristin Malik Ruffino PharmD Clinical Pharmacist 11/16/2016

## 2016-11-16 NOTE — Plan of Care (Signed)
Problem: Activity: Goal: Ability to implement measures to reduce episodes of fatigue will improve Outcome: Progressing Continues on 4 liters of O2, tolerating well with activity, sats in the low to mid 90's.  Problem: Respiratory: Goal: Ability to maintain a clear airway will improve Outcome: Progressing Continues on O2 at 4 liters, lung sounds expitatoty wheezes. Sats in the low 90's. Continues on solu-medrol and duo nebs.

## 2016-11-16 NOTE — Progress Notes (Signed)
[facility] Physicians - [place] at [facility]   PATIENT NAME: Darryl Gonzales    MR#:  [id number]  DATE OF BIRTH:  12-19-1955  SUBJECTIVE:  CHIEF COMPLAINT:   Chief Complaint  Patient presents with  . Shortness of Breath  . Chest Pain     With past medical history significant for history of hypertension, lipidemia, peripheral vascular disease, who presents to the hospital with complaints of 2-3 day history of shortness of breath and chest tightness, cough, yellowish sputum production. On arrival to the hospital patient was noted to have wheezing, chest x-ray revealed pulmonary edema. He was admitted. He was initiated on Lasix, diuresed but not noted total output amount, still does not feel well, complains of shortness of breath.  The patient is not on oxygen at home. He is on 3-4 L of oxygen here in the hospital.  having significant wheezing today.  As per his friend in room- he saw some bugs in room, while there were none. Likely was hallucinations.  Review of Systems  Constitutional: Negative for chills, fever and weight loss.  HENT: Negative for congestion.   Eyes: Negative for blurred vision and double vision.  Respiratory: Positive for cough, sputum production, shortness of breath and wheezing.   Cardiovascular: Negative for chest pain, palpitations, orthopnea, leg swelling and PND.  Gastrointestinal: Negative for abdominal pain, blood in stool, constipation, diarrhea, nausea and vomiting.  Genitourinary: Negative for dysuria, frequency, hematuria and urgency.  Musculoskeletal: Negative for falls.  Neurological: Negative for dizziness, tremors, focal weakness and headaches.  Endo/Heme/Allergies: Does not bruise/bleed easily.  Psychiatric/Behavioral: Negative for depression. The patient does not have insomnia.     VITAL SIGNS: Blood pressure 133/65, pulse 93, temperature 98.6 F (37 C), resp. rate 20, height 5\' 7"  (1.702 m), weight 79.9 kg (176 lb 3.2 oz), SpO2  92 %.  PHYSICAL EXAMINATION:   GENERAL:  61 y.o.-year-old patient lying in the bed in mild-to-moderate respiratory distress, tachypnea, intermittently coughing, and comfortable.  EYES: Pupils equal, round, reactive to light and accommodation. No scleral icterus. Extraocular muscles intact.  HEENT: Head atraumatic, normocephalic. Oropharynx and nasopharynx clear.  NECK:  Supple, no jugular venous distention. No thyroid enlargement, no tenderness.  LUNGS: Barrel  chest. Some diminished breath sounds bilaterally, extensive wheezing, no crepitation. Intermittent use of accessory muscles of respiration .  CARDIOVASCULAR: S1, S2 normal. No murmurs, rubs, or gallops.  ABDOMEN: Soft, nontender, nondistended. Bowel sounds present. No organomegaly or mass.  EXTREMITIES: No pedal edema, cyanosis, or clubbing.  NEUROLOGIC: Cranial nerves II through XII are intact. Muscle strength 5/5 in all extremities. Sensation intact. Gait not checked.  PSYCHIATRIC: The patient is alert and oriented x 3.  SKIN: No obvious rash, lesion, or ulcer.   ORDERS/RESULTS REVIEWED:   CBC  Recent Labs Lab 11/13/16 1640 11/14/16 0453  WBC 11.5* 7.0  HGB 16.2 16.6  HCT 49.5 49.3  PLT 208 225  MCV 103.4* 102.5*  MCH 33.9 34.4*  MCHC 32.8 33.6  RDW 15.1* 14.9*   ------------------------------------------------------------------------------------------------------------------  Chemistries   Recent Labs Lab 11/13/16 1640 11/14/16 0453  NA 134* 137  K 4.2 4.4  CL 101 102  CO2 22 28  GLUCOSE 93 204*  BUN 16 17  CREATININE 1.02 0.93  CALCIUM 8.3* 8.3*  MG 1.8  --    ------------------------------------------------------------------------------------------------------------------ estimated creatinine clearance is 82.3 mL/min (by C-G formula based on SCr of 0.93 mg/dL). ------------------------------------------------------------------------------------------------------------------ No results for input(s):  TSH, T4TOTAL, T3FREE, THYROIDAB in the last 72  hours.  Invalid input(s): FREET3  Cardiac Enzymes  Recent Labs Lab 11/13/16 1640 11/14/16 0453  TROPONINI 0.04* <0.03   ------------------------------------------------------------------------------------------------------------------ Invalid input(s): POCBNP ---------------------------------------------------------------------------------------------------------------  RADIOLOGY: No results found.  EKG:  Orders placed or performed during the hospital encounter of 11/13/16  . ED EKG within 10 minutes  . ED EKG within 10 minutes  . EKG 12-Lead  . EKG 12-Lead    ASSESSMENT AND PLAN:  Principal Problem:   Acute respiratory failure with hypoxia (HCC) Active Problems:   COPD with acute exacerbation (HCC)   Acute respiratory failure (HCC)   DNR (do not resuscitate) discussion   Palliative care by specialist   Acute pulmonary edema (HCC)  #1. Acute respiratory failure with hypoxia, continue oxygen therapy, weaning off oxygen as tolerated, now on 3 L, not on oxygen at home, may need oxygen on discharge, still on 3-4 ltr, instructed to taper down to 2 ltr if he can tolerate. #2. Acute diastolic CHF, continue diuretic, following in's and outs, weight- advised for strict I/O #3. COPD exacerbation due to acute bronchitis, continue patient on Zithromax- added ceftriaxone, get sputum culture if possible, continue steroids, increased dose of budesonide inhaler, continue duo nebs, adding Tussionex and Mucinex, following clinically  Added ceftriaxone for bronchitis.  appreciated pulm consult. #4. Hyponatremia, resolved #5. Leukocytosis, resolved #6. Hyperglycemia, likely due to steroids,  hemoglobin A1c- 6.0 #7. Elevated troponin, likely demand ischemia, check troponin, echocardiogram is unremarkable, adding nitroglycerin topically and aspirin, get cardiologist consultation as outpatient, but he likely will need stress test as outpatient #8.  Alcohol withdrawal   On ativan PRN- received 5-6 doses in last 24 hrs.  Management plans discussed with the patient, family and they are in agreement.  DRUG ALLERGIES:  Allergies  Allergen Reactions  . Sulfa Antibiotics Rash    CODE STATUS:     Code Status Orders        Start     Ordered   11/13/16 2024  Do not attempt resuscitation (DNR)  Continuous    Question Answer Comment  In the event of cardiac or respiratory ARREST Do not call a "code blue"   In the event of cardiac or respiratory ARREST Do not perform Intubation, CPR, defibrillation or ACLS   In the event of cardiac or respiratory ARREST Use medication by any route, position, wound care, and other measures to relive pain and suffering. May use oxygen, suction and manual treatment of airway obstruction as needed for comfort.   Comments confirmed with pt.      11/13/16 2023    Code Status History    Date Active Date Inactive Code Status Order ID Comments User Context   01/18/2015 12:12 PM 01/18/2015  4:48 PM Full Code [id number]  Schnier, Latina CraverGregory G, MD Inpatient      TOTAL TIME TAKING CARE OF THIS PATIENT: 40 minutes.    Altamese DillingVACHHANI, Dane Bloch M.D on 11/16/2016 at 12:22 PM  Between 7am to 6pm - Pager - [phone]  After 6pm go to [email or web address] - password [email or web address]  [facility]io Neighb[facility] [place] [facility]  Office  [phone]  CC: Primary care physician; Patient, No Pcp Per

## 2016-11-17 LAB — GLUCOSE, CAPILLARY
GLUCOSE-CAPILLARY: 196 mg/dL — AB (ref 65–99)
Glucose-Capillary: 152 mg/dL — ABNORMAL HIGH (ref 65–99)
Glucose-Capillary: 169 mg/dL — ABNORMAL HIGH (ref 65–99)
Glucose-Capillary: 140 mg/dL — ABNORMAL HIGH (ref 65–99)

## 2016-11-17 LAB — CREATININE, SERUM
CREATININE: 1.2 mg/dL (ref 0.61–1.24)
GFR calc Af Amer: >60 mL/min (ref >60)
GFR calc non Af Amer: >60 mL/min (ref >60)

## 2016-11-17 MED ORDER — FUROSEMIDE 20 MG PO TABS
20.0000 mg | ORAL_TABLET | Freq: Two times a day (BID) | ORAL | Status: DC
  Administered 2016-11-17 – 2016-11-18 (×2): 20 mg via ORAL
  Filled 2016-11-17 (×2): qty 1

## 2016-11-17 NOTE — Progress Notes (Signed)
Pt walked around nursing station 2 times, no SOB noted, pt sats 100%, HR 60, respiratory rate 24/min, RN notified

## 2016-11-17 NOTE — Progress Notes (Signed)
[facility] [place] at [facility]   [facility]    MR#:  [id number]  DATE OF BIRTH:  02/24/1956  SUBJECTIVE:  CHIEF COMPLAINT:   Chief Complaint  Patient presents with  . Shortness of Breath  . Chest Pain    - tight and wheezing. Good as long as sats>89% - some improvement since admission  REVIEW OF SYSTEMS:  Review of Systems  Constitutional: Negative for chills, fever and malaise/fatigue.  HENT: Negative for ear discharge, hearing loss and nosebleeds.   Eyes: Negative for blurred vision.  Respiratory: Positive for shortness of breath. Negative for cough and wheezing.   Cardiovascular: Negative for chest pain, palpitations and leg swelling.  Gastrointestinal: Negative for abdominal pain, constipation, diarrhea, nausea and vomiting.  Genitourinary: Negative for dysuria.  Musculoskeletal: Negative for myalgias.  Neurological: Negative for dizziness, seizures and headaches.  Psychiatric/Behavioral: The patient is nervous/anxious.     DRUG ALLERGIES:   Allergies  Allergen Reactions  . Sulfa Antibiotics Rash    VITALS:  Blood pressure 135/87, pulse 82, temperature 97.5 F (36.4 C), temperature source Oral, resp. rate 20, height 5\' 7"  (1.702 m), weight 78.7 kg (173 lb 8 oz), SpO2 (!) 89 %.  PHYSICAL EXAMINATION:  Physical Exam  GENERAL:  61 y.o.-year-old patient lying in the bed with no acute distress.  EYES: Pupils equal, round, reactive to light and accommodation. No scleral icterus. Extraocular muscles intact.  HEENT: Head atraumatic, normocephalic. Oropharynx and nasopharynx clear.  NECK:  Supple, no jugular venous distention. No thyroid enlargement, no tenderness.  LUNGS: moving air bilaterally, has significant diffuse expiratory wheezing all over lung fields, no rales,rhonchi or crepitation. No use of accessory muscles of respiration.  CARDIOVASCULAR: S1, S2 normal. No murmurs, rubs, or gallops.  ABDOMEN: Soft, nontender,  nondistended. Bowel sounds present. No organomegaly or mass.  EXTREMITIES: No pedal edema, cyanosis, or clubbing.  NEUROLOGIC: Cranial nerves II through XII are intact. Muscle strength 5/5 in all extremities. Sensation intact. Gait not checked.  PSYCHIATRIC: The patient is alert and oriented x 3. Anxious. SKIN: No obvious rash, lesion, or ulcer.    LABORATORY PANEL:   CBC  Recent Labs Lab 11/14/16 0453  WBC 7.0  HGB 16.6  HCT 49.3  PLT 225   ------------------------------------------------------------------------------------------------------------------  Chemistries   Recent Labs Lab 11/13/16 1640 11/14/16 0453 11/17/16 0407  NA 134* 137  --   K 4.2 4.4  --   CL 101 102  --   CO2 22 28  --   GLUCOSE 93 204*  --   BUN 16 17  --   CREATININE 1.02 0.93 1.20  CALCIUM 8.3* 8.3*  --   MG 1.8  --   --    ------------------------------------------------------------------------------------------------------------------  Cardiac Enzymes  Recent Labs Lab 11/14/16 0453  TROPONINI <0.03   ------------------------------------------------------------------------------------------------------------------  RADIOLOGY:  No results found.  EKG:   Orders placed or performed during the hospital encounter of 11/13/16  . ED EKG within 10 minutes  . ED EKG within 10 minutes  . EKG 12-Lead  . EKG 12-Lead    ASSESSMENT AND PLAN:   61 y/o M with PMH of hyperlipidemia, peripheral neuropathy,  COPD, ongoing smoking admitted with acute on chronic COPD exacerbation  #1 Acute  respiratory failure- secondary to acute on chronic COPD exacerbation - continue steroids, supplemental o2- wean as long as o2 sats>89%, not on home o2 - nebs and inhalers - on rocephin and azithromycin - ECHO for EF, lasix until then  #  2 Tobacco use disorder- nicotine patch, counseled on admission  #3 Alcohol abuse- stable, on CIWA protocol  #4 Peripheral neuropathy- follows with Dr. Malvin Johns Continue  gabapentin  #5 DVT Prophylaxis- on lovenox   All the records are reviewed and case discussed with Care Management/Social Workerr. Management plans discussed with the patient, family and they are in agreement.  CODE STATUS: Full Code  TOTAL TIME TAKING CARE OF THIS PATIENT: 38 minutes.   POSSIBLE D/C IN 1-2 DAYS, DEPENDING ON CLINICAL CONDITION.   Enid Baas M.D on 11/17/2016 at 3:42 PM  Between 7am to 6pm - Pager - [phone]  After 6pm go to [email or web address] - password [email or web address]  [facility]  Office  [phone]  CC: Primary care physician; Patient, No Pcp Per

## 2016-11-18 LAB — CULTURE, BLOOD (ROUTINE X 2)
CULTURE: NO GROWTH
Culture: NO GROWTH
Special Requests: ADEQUATE

## 2016-11-18 LAB — BASIC METABOLIC PANEL
Anion gap: 8 (ref 5–15)
BUN: 36 mg/dL — AB (ref 6–20)
CALCIUM: 8.7 mg/dL — AB (ref 8.9–10.3)
CHLORIDE: 100 mmol/L — AB (ref 101–111)
CO2: 29 mmol/L (ref 22–32)
CREATININE: 0.93 mg/dL (ref 0.61–1.24)
GFR calc non Af Amer: >60 mL/min (ref >60)
Glucose, Bld: 141 mg/dL — ABNORMAL HIGH (ref 65–99)
Potassium: 4.8 mmol/L (ref 3.5–5.1)
SODIUM: 137 mmol/L (ref 135–145)

## 2016-11-18 LAB — GLUCOSE, CAPILLARY
Glucose-Capillary: 138 mg/dL — ABNORMAL HIGH (ref 65–99)
Glucose-Capillary: 122 mg/dL — ABNORMAL HIGH (ref 65–99)

## 2016-11-18 MED ORDER — ALBUTEROL SULFATE (2.5 MG/3ML) 0.083% IN NEBU
2.5000 mg | INHALATION_SOLUTION | Freq: Four times a day (QID) | RESPIRATORY_TRACT | Status: DC
  Administered 2016-11-18: 08:00:00 2.5 mg via RESPIRATORY_TRACT
  Filled 2016-11-18: qty 3

## 2016-11-18 MED ORDER — FLUTICASONE-SALMETEROL 250-50 MCG/DOSE IN AEPB: 1.0000 | INHALATION_SPRAY | Freq: Every day | RESPIRATORY_TRACT | 1 refills | Status: AC

## 2016-11-18 MED ORDER — IPRATROPIUM-ALBUTEROL 0.5-2.5 (3) MG/3ML IN SOLN: 3.0000 mL | Freq: Four times a day (QID) | RESPIRATORY_TRACT | 1 refills | Status: AC | PRN

## 2016-11-18 MED ORDER — ASPIRIN 81 MG PO TBEC: 81.0000 mg | DELAYED_RELEASE_TABLET | Freq: Every day | ORAL | 1 refills | Status: AC

## 2016-11-18 MED ORDER — FUROSEMIDE 20 MG PO TABS: 20.0000 mg | ORAL_TABLET | Freq: Every day | ORAL | 1 refills | Status: AC

## 2016-11-18 MED ORDER — GUAIFENESIN ER 600 MG PO TB12: 600.0000 mg | ORAL_TABLET | Freq: Two times a day (BID) | ORAL | 0 refills | Status: AC

## 2016-11-18 MED ORDER — PREDNISONE 10 MG (21) PO TBPK: ORAL_TABLET | ORAL | 0 refills | Status: DC

## 2016-11-18 MED ORDER — BENZONATATE 200 MG PO CAPS: 200.0000 mg | ORAL_CAPSULE | Freq: Three times a day (TID) | ORAL | 0 refills | Status: AC

## 2016-11-18 MED ORDER — TIOTROPIUM BROMIDE MONOHYDRATE 18 MCG IN CAPS: 18.0000 ug | ORAL_CAPSULE | Freq: Every day | RESPIRATORY_TRACT | 1 refills | Status: AC

## 2016-11-18 MED ORDER — ALBUTEROL SULFATE (2.5 MG/3ML) 0.083% IN NEBU: 2.5000 mg | INHALATION_SOLUTION | RESPIRATORY_TRACT | Status: DC | PRN

## 2016-11-18 NOTE — Plan of Care (Signed)
Problem: Activity: Goal: Ability to tolerate increased activity will improve Outcome: Progressing Pt up in room. Ambulating to bathroom   Problem: Pain Managment: Goal: General experience of comfort will improve Outcome: Progressing No complaints of pain

## 2016-11-18 NOTE — Care Management Note (Addendum)
Case Management Note  Patient Details  Name: Darryl Gonzales MRN: [id number] Date of Birth: 04/19/1956  Subjective/Objective:    Discussed discharge planning with Darryl Gonzales. Darryl Gonzales reports that he is followed outpatient by Dr Philipp OvensHebron Flemming for his COPD. Darryl Gonzales refuses offer of Home Health RN and Respiratory Care. He dis accept offer of a nebulizer machine and new home oxygen to be supplied by [facility]. This Clinical research associatewriter called Darryl Gonzales at [facility] with a request for new oxygen and for a nebulizer machine.    * Previously tried COPD interventions include: Albuterol inhaler, Spiriva inhaler. Darryl Gonzales is currently prescribed Duoneb by nebulization machine, and prednisone tablets as well as new home oxygen.            Action/Plan:   Expected Discharge Date:  11/18/16               Expected Discharge Plan:   11/18/16  In-House Referral:     Discharge planning Services     Post Acute Care Choice:   Yes Choice offered to:   Patient  DME Arranged:   new oxygen, nebulizer machine DME Agency[facility] Arranged:   Refused HH-RN and respiratory care.  HH Agency:     Status of Service:   Completed.  If discussed at Long Length of Stay Meetings, dates discussed:    Additional Comments:  Dru Laurel A, RN 11/18/2016, 10:42 AM

## 2016-11-18 NOTE — Progress Notes (Signed)
Less wheezing, dyspnea improving. O2 sat noted to be 84 while sleeping & came up to 91 on 2 lpm.

## 2016-11-18 NOTE — Progress Notes (Signed)
Received Md order to discharge patient to home, reviewed home meds, discharge instructions follow up appointments and prescriptions with paitient and patient verbalized understanding discharged to home in wheelchair with home o2

## 2016-11-18 NOTE — Progress Notes (Signed)
     Darryl ChandlerMichael Javid was admitted to the [facility] on 11/13/2016 for an acute medical condition and is being Discharged on  11/18/2016 . He  will need another 7 days for recovery and so advised to stay away from work until then. So please excuse him  from work for the above Days. Should be able to return to work from 11/26/16.  Call Enid Baasadhika Tallon Gertz  MD, [facility] at  [phone] with questions.  Enid BaasKALISETTI,Desirey Keahey M.D on 11/18/2016,at 12:10 PM  [facility] [address], [place] [id number]

## 2016-11-18 NOTE — Progress Notes (Signed)
O2 sat taken during sleep

## 2016-11-18 NOTE — Progress Notes (Signed)
SATURATION QUALIFICATIONS: (This note is used to comply with regulatory documentation for home oxygen)  Patient Saturations on Room Air at Rest = 89%  Patient Saturations on Room Air while Ambulating = 82%  Patient Saturations on 2 Liters of oxygen while Ambulating = 85*%  Please briefly explain why patient needs home oxygen:  Patient saturation fell to 82 % while ambulating on room air, COPD  Recovered at rest on 3 L while in room to 95%

## 2016-11-21 NOTE — Discharge Summary (Signed)
[facility] - [place] [facility]   PATIENT NAME: Darryl Gonzales    MR#:  [id number]  DATE OF BIRTH:  [birth year]  DATE OF ADMISSION:  11/13/2016   ADMITTING PHYSICIAN: Altamese DillingVaibhavkumar Vachhani, MD  DATE OF DISCHARGE: 11/18/2016 12:16 PM  PRIMARY CARE PHYSICIAN: Patient, No Pcp Per   ADMISSION DIAGNOSIS:   Edema [R60.9] Acute pulmonary edema (HCC) [J81.0] Acute respiratory failure with hypoxia (HCC) [J96.01] Chronic obstructive pulmonary disease, unspecified COPD type (HCC) [J44.9]  DISCHARGE DIAGNOSIS:   Principal Problem:   Acute respiratory failure with hypoxia (HCC) Active Problems:   COPD with acute exacerbation (HCC)   Acute respiratory failure (HCC)   DNR (do not resuscitate) discussion   Palliative care by specialist   Acute pulmonary edema (HCC)   SECONDARY DIAGNOSIS:   Past Medical History:  Diagnosis Date  . Hypercholesteremia   . Hypertension   . PVD (peripheral vascular disease) [facility](HCC)     HOSPITAL COURSE:   61 y/o M with PMH of hyperlipidemia, peripheral neuropathy,  COPD, ongoing smoking admitted with acute on chronic COPD exacerbation  #1 Acute  respiratory failure- secondary to acute on chronic COPD exacerbation - Received IV steroids, patient qualified for 3 L home oxygen - nebs and inhalers -Was on rocephin and azithromycin-finished antibiotics. Being discharged on prednisone taper, nebulizers, cough medicines and Lasix - ECHO with normal ejection fraction  #2 Tobacco use disorder- nicotine patch, counseled on admission  #3 Alcohol abuse- stable, was on CIWA protocol  #4 Peripheral neuropathy- follows with Dr. Malvin JohnsPotter Continue gabapentin  Being discharged home  DISCHARGE CONDITIONS:   Guarded CONSULTS OBTAINED:   Treatment Team:  Mertie MooresFleming, Herbon E, MD  DRUG ALLERGIES:   Allergies  Allergen Reactions  . Sulfa Antibiotics Rash   DISCHARGE MEDICATIONS:   Allergies as of 11/18/2016      Reactions   Sulfa  Antibiotics Rash      Medication List    STOP taking these medications   clopidogrel 75 MG tablet Commonly known as:  PLAVIX     TAKE these medications   albuterol 108 (90 Base) MCG/ACT inhaler Commonly known as:  PROVENTIL HFA;VENTOLIN HFA Inhale 1 puff into the lungs every 4 (four) hours as needed for wheezing or shortness of breath (every 4-6 hours).   aspirin 81 MG EC tablet Take 1 tablet (81 mg total) by mouth daily.   benzonatate 200 MG capsule Commonly known as:  TESSALON PERLES Take 1 capsule (200 mg total) by mouth 3 (three) times daily.   Fluticasone-Salmeterol 250-50 MCG/DOSE Aepb Commonly known as:  ADVAIR Inhale 1 puff into the lungs daily.   furosemide 20 MG tablet Commonly known as:  LASIX Take 1 tablet (20 mg total) by mouth daily.   gabapentin 300 MG capsule Commonly known as:  NEURONTIN Take 300 mg by mouth 3 (three) times daily.   guaiFENesin 600 MG 12 hr tablet Commonly known as:  MUCINEX Take 1 tablet (600 mg total) by mouth 2 (two) times daily.   ibuprofen 200 MG tablet Commonly known as:  ADVIL,MOTRIN Take 200 mg by mouth every 6 (six) hours as needed.   ipratropium-albuterol 0.5-2.5 (3) MG/3ML Soln Commonly known as:  DUONEB Take 3 mLs by nebulization every 6 (six) hours as needed (wheezing).   predniSONE 10 MG (21) Tbpk tablet Commonly known as:  STERAPRED UNI-PAK 21 TAB 6 tabs PO x 1 day 5 tabs PO x 1 day 4 tabs PO x 1 day 3 tabs PO x 1  day 2 tabs PO x 1 day 1 tab PO x 1 day and stop   tiotropium 18 MCG inhalation capsule Commonly known as:  SPIRIVA Place 1 capsule (18 mcg total) into inhaler and inhale daily. What changed:  when to take this        DISCHARGE INSTRUCTIONS:   1. The subcutaneous follow up in 1-2 weeks 2. Follow up with pulmonologist Dr. Mayo Ao in 1 week  DIET:   Cardiac diet  ACTIVITY:   Activity as tolerated  OXYGEN:   Home Oxygen: Yes.    Oxygen Delivery: 3 liters/min via Patient connected to  nasal cannula oxygen  DISCHARGE LOCATION:   home   If you experience worsening of your admission symptoms, develop shortness of breath, life threatening emergency, suicidal or homicidal thoughts you must seek medical attention immediately by calling 911 or calling your MD immediately  if symptoms less severe.  You Must read complete instructions/literature along with all the possible adverse reactions/side effects for all the Medicines you take and that have been prescribed to you. Take any new Medicines after you have completely understood and accpet all the possible adverse reactions/side effects.   Please note  You were cared for by a hospitalist during your hospital stay. If you have any questions about your discharge medications or the care you received while you were in the hospital after you are discharged, you can call the unit and asked to speak with the hospitalist on call if the hospitalist that took care of you is not available. Once you are discharged, your primary care physician will handle any further medical issues. Please note that NO REFILLS for any discharge medications will be authorized once you are discharged, as it is imperative that you return to your primary care physician (or establish a relationship with a primary care physician if you do not have one) for your aftercare needs so that they can reassess your need for medications and monitor your lab values.    On the day of Discharge:  VITAL SIGNS:   Blood pressure (!) 140/91, pulse 87, temperature 97.7 F (36.5 C), temperature source Oral, resp. rate 17, height 5\' 7"  (1.702 m), weight 77.5 kg (170 lb 14.4 oz), SpO2 95 %.  PHYSICAL EXAMINATION:    GENERAL:  61 y.o.-year-old patient lying in the bed with no acute distress.  EYES: Pupils equal, round, reactive to light and accommodation. No scleral icterus. Extraocular muscles intact.  HEENT: Head atraumatic, normocephalic. Oropharynx and nasopharynx clear.  NECK:   Supple, no jugular venous distention. No thyroid enlargement, no tenderness.  LUNGS: moving air bilaterally, has significant diffuse expiratory wheezing all over lung fields, no rales,rhonchi or crepitation. No use of accessory muscles of respiration.  He does have wheezing at baseline CARDIOVASCULAR: S1, S2 normal. No murmurs, rubs, or gallops.  ABDOMEN: Soft, nontender, nondistended. Bowel sounds present. No organomegaly or mass.  EXTREMITIES: No pedal edema, cyanosis, or clubbing.  NEUROLOGIC: Cranial nerves II through XII are intact. Muscle strength 5/5 in all extremities. Sensation intact. Gait not checked.  PSYCHIATRIC: The patient is alert and oriented x 3. Anxious. SKIN: No obvious rash, lesion, or ulcer.   DATA REVIEW:   CBC No results for input(s): WBC, HGB, HCT, PLT in the last 168 hours.  Chemistries   Recent Labs Lab 11/18/16 0505  NA 137  K 4.8  CL 100*  CO2 29  GLUCOSE 141*  BUN 36*  CREATININE 0.93  CALCIUM 8.7*     Microbiology Results  Results for orders placed or performed during the hospital encounter of 11/13/16  Blood Culture (routine x 2)     Status: None   Collection Time: 11/13/16  6:00 PM  Result Value Ref Range Status   Specimen Description BLOOD RIGHT HAND  Final   Special Requests   Final    Blood Culture results may not be optimal due to an inadequate volume of blood received in culture bottles   Culture NO GROWTH 5 DAYS  Final   Report Status 11/18/2016 FINAL  Final  Blood Culture (routine x 2)     Status: None   Collection Time: 11/13/16  6:12 PM  Result Value Ref Range Status   Specimen Description BLOOD RIGHT [facility]  Final   Special Requests Blood Culture adequate volume  Final   Culture NO GROWTH 5 DAYS  Final   Report Status 11/18/2016 FINAL  Final    RADIOLOGY:  No results found.   Management plans discussed with the patient, family and they are in agreement.  CODE STATUS:  Code Status History    Date Active Date Inactive Code  Status Order ID Comments User Context   11/13/2016  8:23 PM 11/18/2016  6:00 PM DNR [id number]  Altamese Dilling, MD Inpatient   01/18/2015 12:12 PM 01/18/2015  4:48 PM Full Code [id number]  Schnier, Latina Craver, MD Inpatient    Questions for Most Recent Historical Code Status (Order [id number])    Question Answer Comment   In the event of cardiac or respiratory ARREST Do not call a "code blue"    In the event of cardiac or respiratory ARREST Do not perform Intubation, CPR, defibrillation or ACLS    In the event of cardiac or respiratory ARREST Use medication by any route, position, wound care, and other measures to relive pain and suffering. May use oxygen, suction and manual treatment of airway obstruction as needed for comfort.    Comments confirmed with pt.       TOTAL TIME TAKING CARE OF THIS PATIENT: 37 minutes.    Andros Channing M.D on 11/21/2016 at 10:44 AM  Between 7am to 6pm - Pager - [phone]  After 6pm go to [email or web address] - Social research officer, government  [facility]  Office  [phone]  CC: Primary care physician; Patient, No Pcp Per   Note: This dictation was prepared with Dragon dictation along with smaller phrase technology. Any transcriptional errors that result from this process are unintentional.

## 2016-11-28 ENCOUNTER — Ambulatory Visit: Payer: BLUE CROSS/BLUE SHIELD | Attending: Family | Admitting: Family

## 2016-11-28 ENCOUNTER — Encounter: Payer: Self-pay | Admitting: Family

## 2016-11-28 DIAGNOSIS — F419 Anxiety disorder, unspecified: Secondary | ICD-10-CM | POA: Diagnosis not present

## 2016-11-28 DIAGNOSIS — F17210 Nicotine dependence, cigarettes, uncomplicated: Secondary | ICD-10-CM | POA: Diagnosis not present

## 2016-11-28 DIAGNOSIS — I5032 Chronic diastolic (congestive) heart failure: Secondary | ICD-10-CM | POA: Diagnosis present

## 2016-11-28 DIAGNOSIS — E7800 Pure hypercholesterolemia, unspecified: Secondary | ICD-10-CM | POA: Insufficient documentation

## 2016-11-28 DIAGNOSIS — Z7982 Long term (current) use of aspirin: Secondary | ICD-10-CM | POA: Insufficient documentation

## 2016-11-28 DIAGNOSIS — J449 Chronic obstructive pulmonary disease, unspecified: Secondary | ICD-10-CM | POA: Insufficient documentation

## 2016-11-28 DIAGNOSIS — I739 Peripheral vascular disease, unspecified: Secondary | ICD-10-CM | POA: Insufficient documentation

## 2016-11-28 DIAGNOSIS — I110 Hypertensive heart disease with heart failure: Secondary | ICD-10-CM | POA: Diagnosis not present

## 2016-11-28 DIAGNOSIS — Z720 Tobacco use: Secondary | ICD-10-CM

## 2016-11-28 DIAGNOSIS — J410 Simple chronic bronchitis: Secondary | ICD-10-CM

## 2016-11-28 NOTE — Progress Notes (Addendum)
Patient ID: Darryl Gonzales, male    DOB: 03-18-1956, 61 y.o.   MRN: [id number]  HPI  Darryl Gonzales is a 61 y/o male with a history of PVD, HTN, hyperlipidemia, COPD, anxiety, hard of hearing, current tobacco use and chronic heart failure.   Reviewed echo done 11/13/16 which showed an EF of 50-55% along with trivial AR.  Admitted 11/13/16 with COPD exacerbation. Treated with IV steroids, nebulizers and inhalers. Qualified for home oxygen. Discharged home after 5 days.   He presents today for his initial visit with a chief complaint of mild shortness of breath with minimal exertion. He says that this has been present for a few weeks and seems to be consistent since hospital discharge. He has associated fatigue, wheezing, dizziness and anxiety along with this.   Past Medical History:  Diagnosis Date  . COPD (chronic obstructive pulmonary disease) ([place])   . Hypercholesteremia   . Hypertension   . PVD (peripheral vascular disease) ([place])    Past Surgical History:  Procedure Laterality Date  . APPENDECTOMY    . ELBOW SURGERY    . PERIPHERAL VASCULAR CATHETERIZATION Left 01/18/2015   Procedure: Lower Extremity Angiography;  Surgeon: Katha Cabal, MD;  Location: [facility];  Service: Cardiovascular;  Laterality: Left;  . PERIPHERAL VASCULAR CATHETERIZATION  01/18/2015   Procedure: Lower Extremity Intervention;  Surgeon: Katha Cabal, MD;  Location: [facility];  Service: Cardiovascular;;   Family History  Problem Relation Age of Onset  . Multiple myeloma Sister    Social History  Substance Use Topics  . Smoking status: Current Every Day Smoker    Packs/day: 1.50    Types: Cigarettes  . Smokeless tobacco: Never Used  . Alcohol use No     Comment: occassional drink   Allergies  Allergen Reactions  . Sulfa Antibiotics Rash   Prior to Admission medications   Medication Sig Start Date End Date Taking? Authorizing Provider  albuterol (PROVENTIL HFA;VENTOLIN HFA)  108 (90 BASE) MCG/ACT inhaler Inhale 1 puff into the lungs every 4 (four) hours as needed for wheezing or shortness of breath (every 4-6 hours).   Yes [provider]  aspirin EC 81 MG EC tablet Take 1 tablet (81 mg total) by mouth daily. 11/19/16  Yes Gladstone Lighter, MD  Fluticasone-Salmeterol (ADVAIR) 250-50 MCG/DOSE AEPB Inhale 1 puff into the lungs daily. 11/18/16  Yes Gladstone Lighter, MD  gabapentin (NEURONTIN) 300 MG capsule Take 300 mg by mouth 3 (three) times daily.   Yes [provider]  guaiFENesin (MUCINEX) 600 MG 12 hr tablet Take 1 tablet (600 mg total) by mouth 2 (two) times daily. 11/18/16  Yes Gladstone Lighter, MD  ibuprofen (ADVIL,MOTRIN) 200 MG tablet Take 200 mg by mouth every 6 (six) hours as needed.   Yes [provider]  ipratropium-albuterol (DUONEB) 0.5-2.5 (3) MG/3ML SOLN Take 3 mLs by nebulization every 6 (six) hours as needed (wheezing). 11/18/16  Yes Gladstone Lighter, MD  tiotropium (SPIRIVA) 18 MCG inhalation capsule Place 1 capsule (18 mcg total) into inhaler and inhale daily. 11/18/16  Yes Gladstone Lighter, MD  furosemide (LASIX) 20 MG tablet Take 1 tablet (20 mg total) by mouth daily. Patient not taking: Reported on 11/28/2016 11/18/16   Gladstone Lighter, MD    Review of Systems  Constitutional: Positive for fatigue. Negative for appetite change.  HENT: Positive for congestion. Negative for postnasal drip and sore throat.   Eyes: Negative.   Respiratory: Positive for shortness of breath  and wheezing (at times; improved with nebulizer). Negative for chest tightness.   Cardiovascular: Positive for palpitations. Negative for chest pain and leg swelling.  Gastrointestinal: Negative for abdominal distention and abdominal pain.  Endocrine: Negative.   Genitourinary: Negative.   Musculoskeletal: Negative for back pain and neck pain.  Skin: Negative.   Allergic/Immunologic: Negative.   Neurological: Positive for dizziness and  light-headedness.  Hematological: Negative for adenopathy. Bruises/bleeds easily.  Psychiatric/Behavioral: Negative for dysphoric mood and sleep disturbance (sleeping on 4-6 pillows; wearing oxygen at 3L around the clock). The patient is nervous/anxious.    Vitals:   11/28/16 1302  BP: (!) 144/87  Pulse: 95  Resp: 20  SpO2: 91%  Weight: 170 lb (77.1 kg)  Height: '5\' 7"'$  (1.702 m)   Wt Readings from Last 3 Encounters:  11/28/16 170 lb (77.1 kg)  11/18/16 170 lb 14.4 oz (77.5 kg)  01/18/15 174 lb (78.9 kg)   Lab Results  Component Value Date   CREATININE 0.93 11/18/2016   CREATININE 1.20 11/17/2016   CREATININE 0.93 11/14/2016    Physical Exam  Constitutional: He is oriented to person, place, and time. He appears well-developed and well-nourished.  HENT:  Head: Normocephalic and atraumatic.  Neck: Normal range of motion. Neck supple. No JVD present.  Cardiovascular: Regular rhythm.  Tachycardia present.   Pulmonary/Chest: Effort normal. He has wheezes in the right lower field and the left lower field. He has no rales.  Abdominal: Soft. He exhibits no distension. There is no tenderness.  Musculoskeletal: He exhibits edema (trace edema around bilateral ankles). He exhibits no tenderness.  Neurological: He is alert and oriented to person, place, and time.  Skin: Skin is warm and dry.  Psychiatric: His speech is normal. Thought content normal. His mood appears anxious. He is agitated. He does not exhibit a depressed mood.  Nursing note and vitals reviewed.  Assessment & Plan:  1: Chronic heart failure with preserved ejection fraction- - NYHA class III - euvolemic today - doesn't have scales so a set of scales was given to him. Instructed to begin weighing daily and call for an overnight weight gain of >2 pounds or a weekly weight gain of >5 pounds - not adding salt. Importance of closely following a '2000mg'$  sodium diet was discussed and written information was given to him about  this - had not been taking his furosemide as he didn't realize he had it as the bottle had dropped behind something. He will begin it tomorrow. - does not have a cardiologist yet  2: COPD- - wearing oxygen at 3L around the clock and is very agitated with having to wear it - last saw pulmonologist Raul Del) 02/01/16 - advised patient and daughter to call Dr. Gust Brooms office and get a follow-up appointment scheduled.  3: Anxiety- - patient very anxious and agitated about having to wear oxygen and his inability to work due to his shortness of breath  - he is greatly concerned about his job so a work note was given to him to cover him until he returns in 2 weeks - spent a lot of time discussing COPD and that this can wax and wane in severity - does not have a PCP so provider name/number given to he and his daughter and encouraged to call to possibly get assistance in managing his anxiety  4: Tobacco use- - smoking 1.5 ppd of cigarettes which is quite a bit less than previously - complete cessation discussed for 3 minutes with him - he  says that he does remove his oxygen when he smokes  Medications were reviewed.  Return here in 2 weeks or sooner for any questions/problems before then.

## 2016-11-28 NOTE — Patient Instructions (Addendum)
Begin weighing daily and call for an overnight weight gain of > 2 pounds or a weekly weight gain of >5 pounds.  Contact Everlene OtherJayce Cook for follow up with Primary Care.  [facility] [facility]  [address], [address]  [place], [place] [address]  [phone]

## 2016-11-29 ENCOUNTER — Encounter: Payer: Self-pay | Admitting: Family

## 2016-11-29 DIAGNOSIS — I5032 Chronic diastolic (congestive) heart failure: Secondary | ICD-10-CM | POA: Insufficient documentation

## 2016-11-29 DIAGNOSIS — J449 Chronic obstructive pulmonary disease, unspecified: Secondary | ICD-10-CM | POA: Insufficient documentation

## 2016-11-29 DIAGNOSIS — F419 Anxiety disorder, unspecified: Secondary | ICD-10-CM | POA: Insufficient documentation

## 2016-11-29 DIAGNOSIS — I10 Essential (primary) hypertension: Secondary | ICD-10-CM | POA: Insufficient documentation

## 2016-11-29 DIAGNOSIS — Z720 Tobacco use: Secondary | ICD-10-CM | POA: Insufficient documentation

## 2016-12-17 ENCOUNTER — Ambulatory Visit: Payer: BLUE CROSS/BLUE SHIELD | Admitting: Family

## 2016-12-17 ENCOUNTER — Telehealth: Payer: Self-pay | Admitting: Family

## 2016-12-17 NOTE — Telephone Encounter (Signed)
Patient did not show for his [facility] appointment on 12/17/16. Will attempt to reschedule.  

## 2017-01-02 DEATH — deceased

## 2019-03-02 IMAGING — CR DG CHEST 2V
2 series · 2 of 2 positions shown · non-contrast
Comparison: Chest x-ray of November 13, 2016

CLINICAL DATA: Shortness of breath and chest tightness with some
productive cough and chills. Wheezing on exam. History of COPD.
Current smoker.

EXAM:
CHEST  2 VIEW

[chest pa]
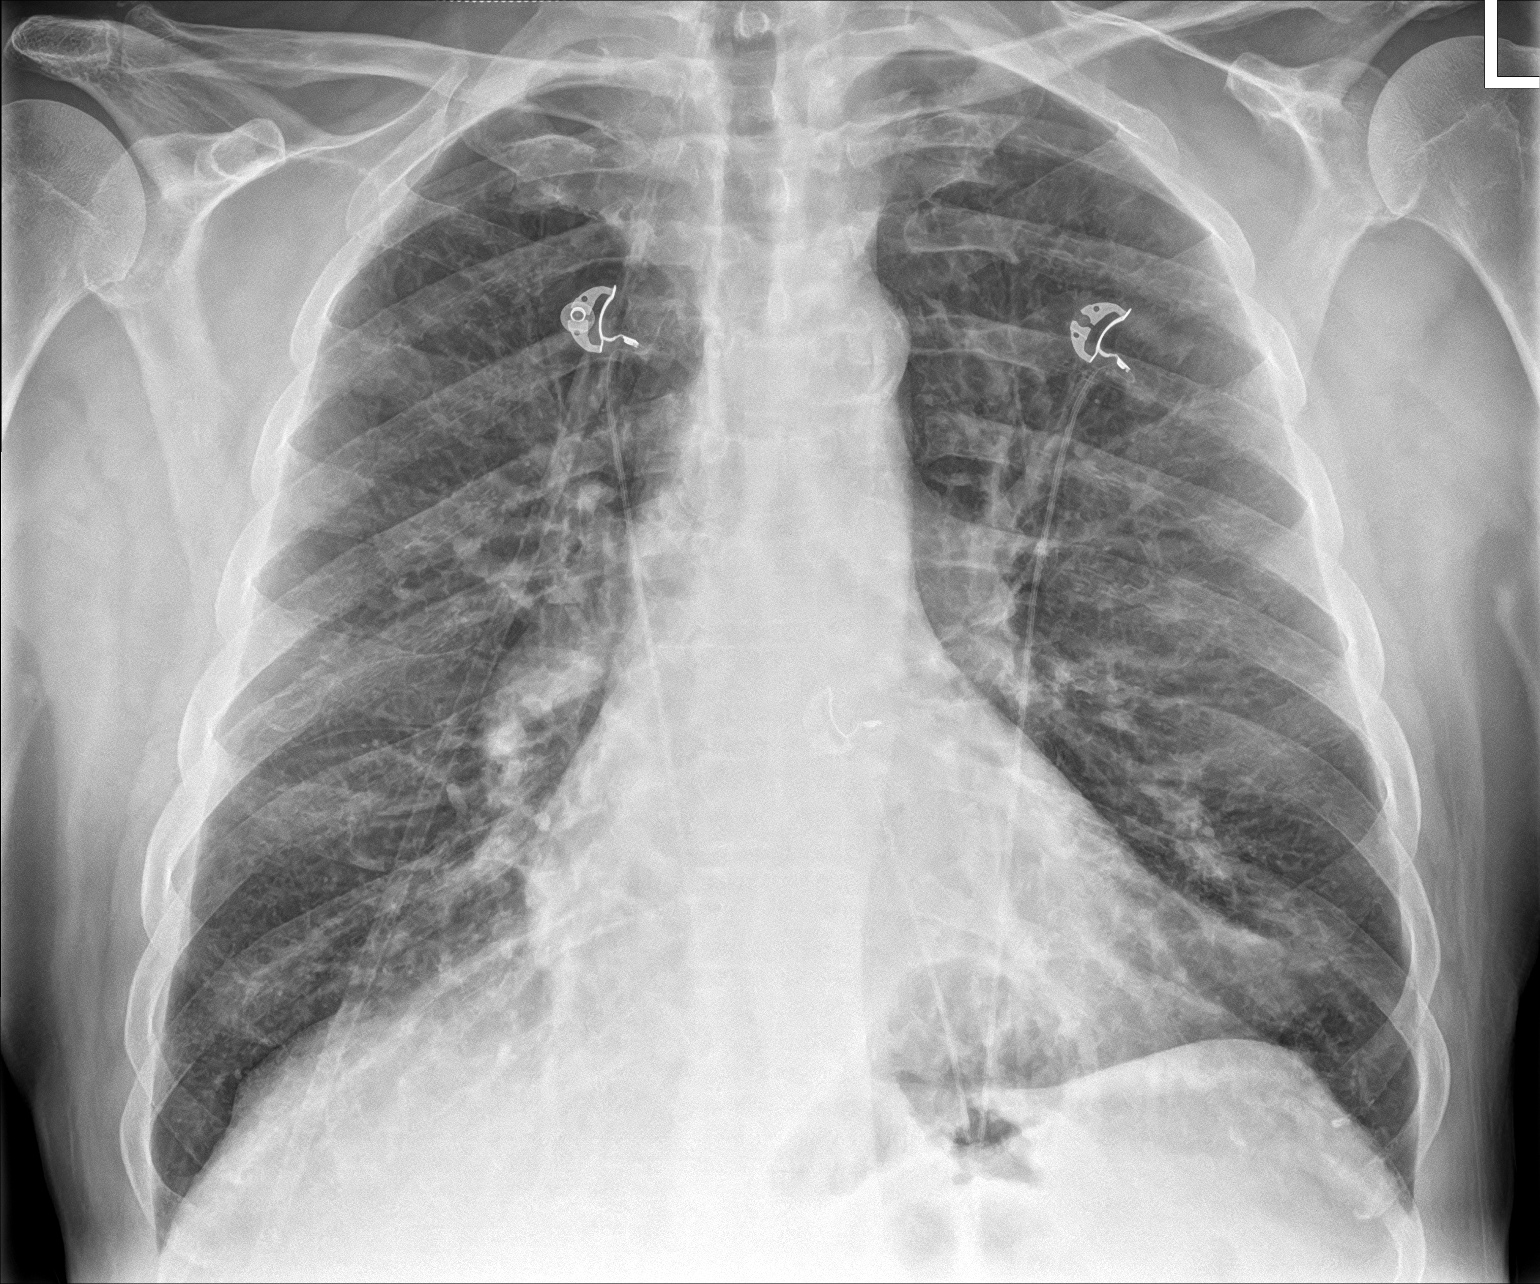

[chest lat]
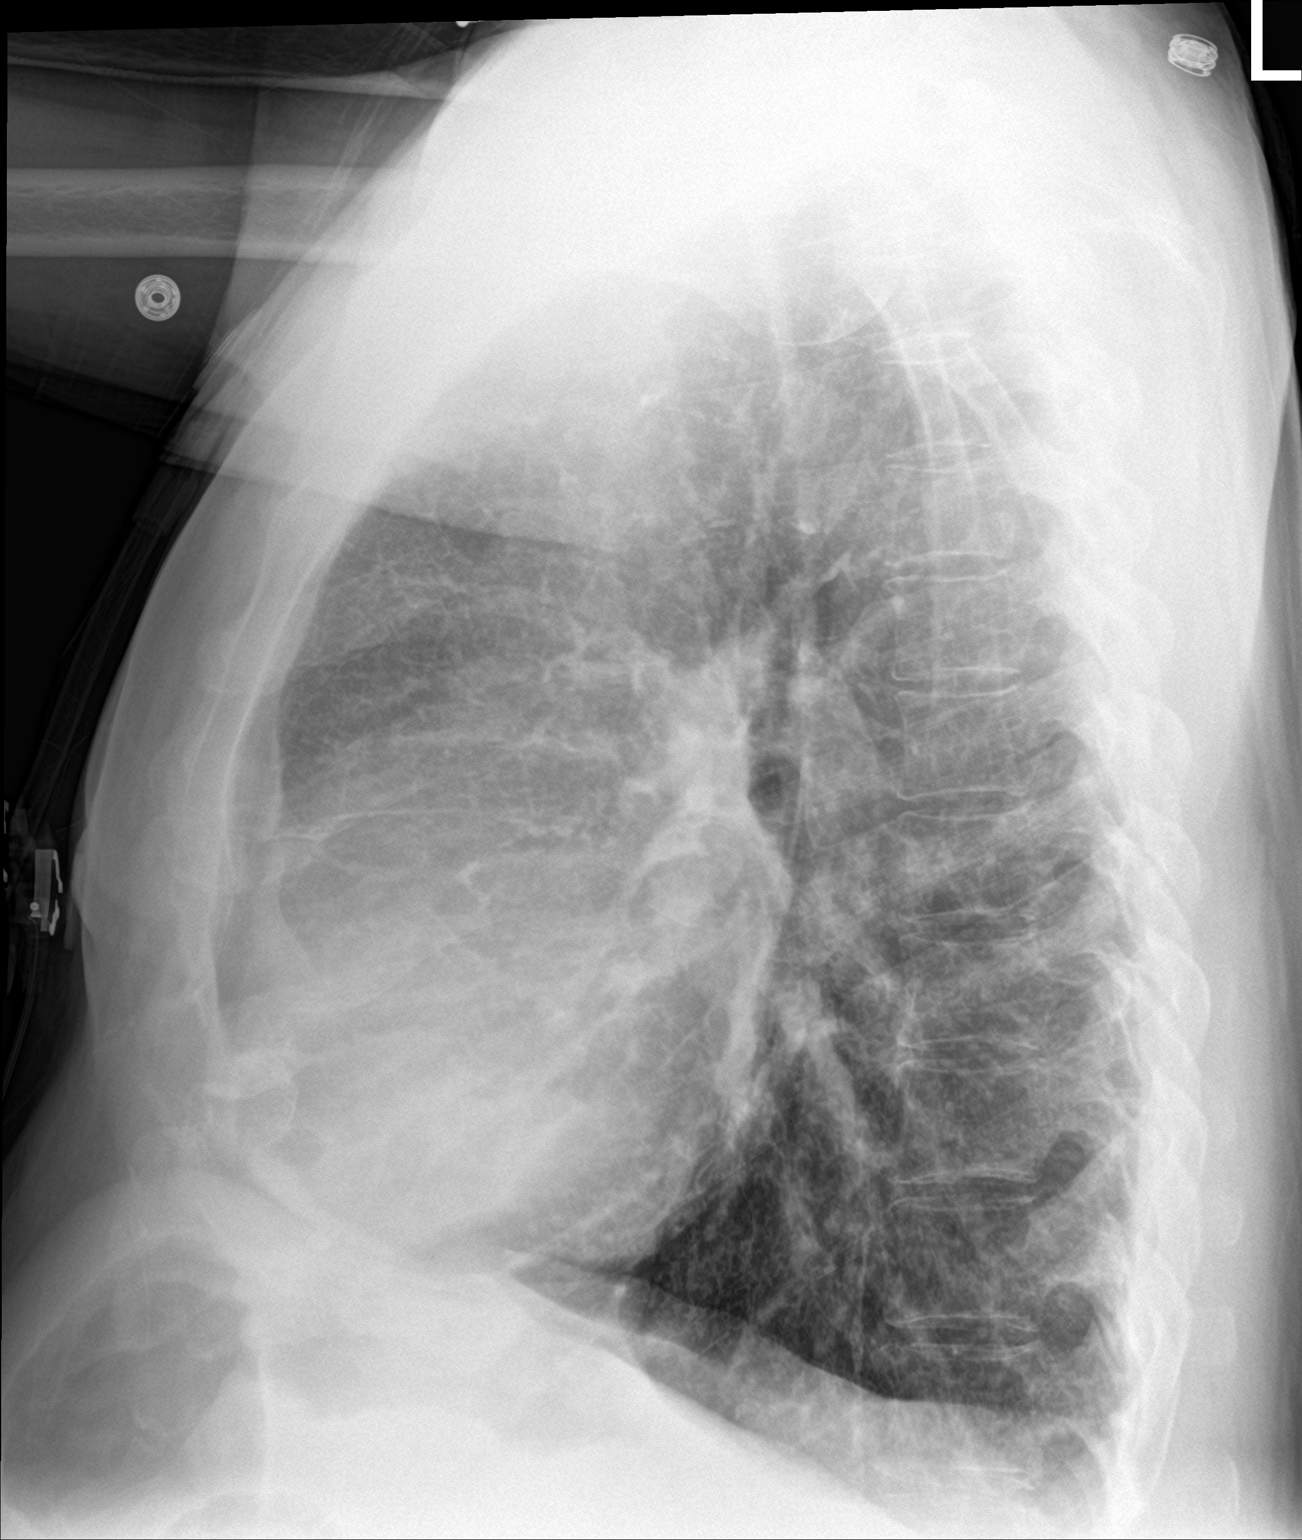

[2 of 2 positions shown; findings below may reference images not displayed]

FINDINGS: The lungs are hyperinflated. The interstitial markings are increased
bilaterally. Increased density at the right lung base is consistent
with acute atelectasis or infiltrate likely in the lingula. The
heart and pulmonary vascularity are normal. There is calcification
in the wall of the aortic arch. The trachea is midline. The bony
thorax exhibits no acute abnormality.
IMPRESSION: COPD. Chronic interstitial prominence with superimposed subsegmental
atelectasis or acute pneumonia in the lingula. No pulmonary edema.
Followup PA and lateral chest X-ray is recommended in 3-4 weeks
following trial of antibiotic therapy to ensure resolution and
exclude underlying malignancy.

Thoracic aortic atherosclerosis.
